# Patient Record
Sex: Female | Born: 1960
Health system: Southern US, Community
[De-identification: ages and names within clinical notes are randomized; demographics above are authoritative.]

## PROBLEM LIST (undated history)

## (undated) DIAGNOSIS — M1990 Unspecified osteoarthritis, unspecified site: Secondary | ICD-10-CM

## (undated) DIAGNOSIS — M549 Dorsalgia, unspecified: Secondary | ICD-10-CM

## (undated) DIAGNOSIS — I10 Essential (primary) hypertension: Secondary | ICD-10-CM

## (undated) HISTORY — PX: TUBAL LIGATION: SHX77

---

## 2009-09-05 ENCOUNTER — Emergency Department (HOSPITAL_BASED_OUTPATIENT_CLINIC_OR_DEPARTMENT_OTHER): Admission: EM | Admit: 2009-09-05 | Discharge: 2009-09-06 | Payer: Self-pay | Admitting: Emergency Medicine

## 2009-09-06 ENCOUNTER — Ambulatory Visit: Payer: Self-pay | Admitting: Radiology

## 2010-01-12 ENCOUNTER — Emergency Department (HOSPITAL_BASED_OUTPATIENT_CLINIC_OR_DEPARTMENT_OTHER): Admission: EM | Admit: 2010-01-12 | Discharge: 2010-01-12 | Payer: Self-pay | Admitting: Emergency Medicine

## 2010-05-01 ENCOUNTER — Emergency Department (HOSPITAL_BASED_OUTPATIENT_CLINIC_OR_DEPARTMENT_OTHER): Admission: EM | Admit: 2010-05-01 | Discharge: 2010-05-02 | Payer: Self-pay | Admitting: Emergency Medicine

## 2010-07-09 ENCOUNTER — Emergency Department (HOSPITAL_BASED_OUTPATIENT_CLINIC_OR_DEPARTMENT_OTHER): Admission: EM | Admit: 2010-07-09 | Discharge: 2010-07-09 | Payer: Self-pay | Admitting: Emergency Medicine

## 2010-10-28 LAB — CBC
HCT: 32.5 % — ABNORMAL LOW (ref 36.0–46.0)
MCV: 78.6 fL (ref 78.0–100.0)
Platelets: 268 10*3/uL (ref 150–400)
RBC: 4.13 MIL/uL (ref 3.87–5.11)
WBC: 9 10*3/uL (ref 4.0–10.5)

## 2010-10-28 LAB — DIFFERENTIAL
Basophils Absolute: 0.1 10*3/uL (ref 0.0–0.1)
Basophils Relative: 1 % (ref 0–1)
Monocytes Absolute: 0.7 10*3/uL (ref 0.1–1.0)
Neutro Abs: 6 10*3/uL (ref 1.7–7.7)
Neutrophils Relative %: 68 % (ref 43–77)

## 2010-10-28 LAB — COMPREHENSIVE METABOLIC PANEL
ALT: 11 U/L (ref 0–35)
Albumin: 4.1 g/dL (ref 3.5–5.2)
Alkaline Phosphatase: 54 U/L (ref 39–117)
BUN: 19 mg/dL (ref 6–23)
Chloride: 106 mEq/L (ref 96–112)
Glucose, Bld: 87 mg/dL (ref 70–99)
Potassium: 4 mEq/L (ref 3.5–5.1)
Sodium: 142 mEq/L (ref 135–145)
Total Bilirubin: 0.5 mg/dL (ref 0.3–1.2)

## 2010-10-28 LAB — LIPASE, BLOOD: Lipase: 94 U/L (ref 23–300)

## 2010-10-28 LAB — POCT CARDIAC MARKERS: Myoglobin, poc: 51.3 ng/mL (ref 12–200)

## 2011-01-09 ENCOUNTER — Emergency Department (HOSPITAL_BASED_OUTPATIENT_CLINIC_OR_DEPARTMENT_OTHER)
Admission: EM | Admit: 2011-01-09 | Discharge: 2011-01-09 | Disposition: A | Discharge status: Home / Self Care | Payer: Self-pay | Attending: Emergency Medicine | Admitting: Emergency Medicine

## 2011-01-09 DIAGNOSIS — N898 Other specified noninflammatory disorders of vagina: Secondary | ICD-10-CM | POA: Insufficient documentation

## 2011-01-09 DIAGNOSIS — I10 Essential (primary) hypertension: Secondary | ICD-10-CM | POA: Insufficient documentation

## 2011-01-09 DIAGNOSIS — G8929 Other chronic pain: Secondary | ICD-10-CM | POA: Insufficient documentation

## 2011-01-09 LAB — DIFFERENTIAL
Basophils Absolute: 0 10*3/uL (ref 0.0–0.1)
Basophils Relative: 0 % (ref 0–1)
Eosinophils Absolute: 0.1 10*3/uL (ref 0.0–0.7)
Eosinophils Relative: 1 % (ref 0–5)
Lymphocytes Relative: 21 % (ref 12–46)
Monocytes Absolute: 0.6 10*3/uL (ref 0.1–1.0)

## 2011-01-09 LAB — CBC
Hemoglobin: 10.4 g/dL — ABNORMAL LOW (ref 12.0–15.0)
MCH: 25.7 pg — ABNORMAL LOW (ref 26.0–34.0)
MCV: 77.5 fL — ABNORMAL LOW (ref 78.0–100.0)
RBC: 4.04 MIL/uL (ref 3.87–5.11)
WBC: 8.2 10*3/uL (ref 4.0–10.5)

## 2011-01-09 LAB — WET PREP, GENITAL: Trich, Wet Prep: NONE SEEN

## 2011-01-09 LAB — PREGNANCY, URINE: Preg Test, Ur: NEGATIVE

## 2011-01-10 ENCOUNTER — Ambulatory Visit (INDEPENDENT_AMBULATORY_CARE_PROVIDER_SITE_OTHER)
Admit: 2011-01-10 | Discharge: 2011-01-10 | Disposition: A | Discharge status: Home / Self Care | Payer: Self-pay | Attending: Emergency Medicine | Admitting: Emergency Medicine

## 2011-01-10 ENCOUNTER — Other Ambulatory Visit: Payer: Self-pay | Admitting: Hematology & Oncology

## 2011-01-10 ENCOUNTER — Ambulatory Visit (HOSPITAL_BASED_OUTPATIENT_CLINIC_OR_DEPARTMENT_OTHER)
Admission: RE | Admit: 2011-01-10 | Discharge: 2011-01-10 | Disposition: A | Discharge status: Home / Self Care | Payer: Self-pay | Source: Ambulatory Visit | Attending: Emergency Medicine | Admitting: Emergency Medicine

## 2011-01-10 DIAGNOSIS — N898 Other specified noninflammatory disorders of vagina: Secondary | ICD-10-CM

## 2011-01-10 LAB — GC/CHLAMYDIA PROBE AMP, GENITAL: GC Probe Amp, Genital: NEGATIVE

## 2011-03-01 ENCOUNTER — Emergency Department (HOSPITAL_BASED_OUTPATIENT_CLINIC_OR_DEPARTMENT_OTHER)
Admission: EM | Admit: 2011-03-01 | Discharge: 2011-03-01 | Disposition: A | Discharge status: Home / Self Care | Payer: Self-pay | Attending: Emergency Medicine | Admitting: Emergency Medicine

## 2011-03-01 ENCOUNTER — Encounter: Payer: Self-pay | Admitting: Emergency Medicine

## 2011-03-01 DIAGNOSIS — A499 Bacterial infection, unspecified: Secondary | ICD-10-CM | POA: Insufficient documentation

## 2011-03-01 DIAGNOSIS — R350 Frequency of micturition: Secondary | ICD-10-CM | POA: Insufficient documentation

## 2011-03-01 DIAGNOSIS — N760 Acute vaginitis: Secondary | ICD-10-CM | POA: Insufficient documentation

## 2011-03-01 DIAGNOSIS — B9689 Other specified bacterial agents as the cause of diseases classified elsewhere: Secondary | ICD-10-CM | POA: Insufficient documentation

## 2011-03-01 DIAGNOSIS — Z8739 Personal history of other diseases of the musculoskeletal system and connective tissue: Secondary | ICD-10-CM | POA: Insufficient documentation

## 2011-03-01 HISTORY — DX: Unspecified osteoarthritis, unspecified site: M19.90

## 2011-03-01 LAB — URINALYSIS, ROUTINE W REFLEX MICROSCOPIC
Glucose, UA: NEGATIVE mg/dL
Hgb urine dipstick: NEGATIVE
Leukocytes, UA: NEGATIVE
Protein, ur: NEGATIVE mg/dL
Specific Gravity, Urine: 1.012 (ref 1.005–1.030)
pH: 7 (ref 5.0–8.0)

## 2011-03-01 LAB — WET PREP, GENITAL
Trich, Wet Prep: NONE SEEN
Yeast Wet Prep HPF POC: NONE SEEN

## 2011-03-01 MED ORDER — METRONIDAZOLE 500 MG PO TABS: 500.0000 mg | ORAL_TABLET | Freq: Two times a day (BID) | ORAL | Status: AC

## 2011-03-01 NOTE — ED Notes (Signed)
Report received from C. Deeann Cree

## 2011-03-01 NOTE — ED Notes (Signed)
Pt reports UTI symtoms x 48hrs

## 2011-03-01 NOTE — ED Provider Notes (Signed)
History     Chief Complaint  Patient presents with  . Urinary Frequency   HPI Comments: Pt has had lower abdominal pain for 3 days.  She has had mild dysuria and a vaginal discharge.  There has been no fever.  Patient is a 50 y.o. female presenting with abdominal pain.  Abdominal Pain The primary symptoms of the illness include abdominal pain, dysuria and vaginal discharge. The primary symptoms of the illness do not include fever. The current episode started more than 2 days ago. The onset of the illness was gradual. The problem has been gradually worsening.  The abdominal pain is located in the suprapubic region. The abdominal pain does not radiate. The severity of the abdominal pain is 2/10. The abdominal pain is relieved by nothing.  The vaginal discharge was first noticed 2 days ago. Vaginal discharge is a new problem. The amount of discharge is scant. The vaginal discharge is associated with dysuria.  The patient states that she believes she is currently not pregnant. The patient has not had a change in bowel habit.    Past Medical History  Diagnosis Date  . Cancer   . Arthritis     Past Surgical History  Procedure Date  . Cesarean section     No family history on file.  History  Substance Use Topics  . Smoking status: Not on file  . Smokeless tobacco: Not on file  . Alcohol Use: No    OB History    Grav Para Term Preterm Abortions TAB SAB Ect Mult Living                  Review of Systems  Constitutional: Negative.  Negative for fever.  HENT: Negative.   Eyes: Negative.   Respiratory: Negative.   Gastrointestinal: Positive for abdominal pain.  Genitourinary: Positive for dysuria and vaginal discharge.  Musculoskeletal: Negative.   Skin: Negative.   Psychiatric/Behavioral: Negative.     Physical Exam  BP 148/94  Pulse 73  Temp 98.6 F (37 C)  Resp 16  SpO2 100%  Physical Exam  Constitutional: She is oriented to person, place, and time. She appears  well-developed and well-nourished. No distress.  HENT:  Head: Normocephalic and atraumatic.  Eyes: EOM are normal. Pupils are equal, round, and reactive to light.  Neck: Normal range of motion. Neck supple.  Cardiovascular: Normal rate, regular rhythm and normal heart sounds.   Pulmonary/Chest: Effort normal and breath sounds normal.  Abdominal: Soft. Bowel sounds are normal. There is Tenderness: She localizes pain over the suprapubic region but there is no mass or tenderness therer..  Genitourinary: Uterus normal. Cervix exhibits discharge. Right adnexum displays no mass and no tenderness. Left adnexum displays no mass and no tenderness.  Musculoskeletal: Normal range of motion.  Neurological: She is alert and oriented to person, place, and time.  Skin: Skin is warm.  Psychiatric: She has a normal mood and affect.    ED Course  Procedures  MDM  Lab results showed a normal UA and a wet prep showing clue cells.  Rx with metronidazole to treat bacterial vaginosis.      Carleene Cooper III, MD 03/01/11 2041

## 2011-03-03 LAB — GC/CHLAMYDIA PROBE AMP, GENITAL
Chlamydia, DNA Probe: NEGATIVE
GC Probe Amp, Genital: NEGATIVE

## 2011-03-20 ENCOUNTER — Emergency Department (HOSPITAL_BASED_OUTPATIENT_CLINIC_OR_DEPARTMENT_OTHER)
Admission: EM | Admit: 2011-03-20 | Discharge: 2011-03-20 | Disposition: A | Discharge status: Home / Self Care | Payer: Self-pay | Attending: Emergency Medicine | Admitting: Emergency Medicine

## 2011-03-20 ENCOUNTER — Encounter (HOSPITAL_BASED_OUTPATIENT_CLINIC_OR_DEPARTMENT_OTHER): Payer: Self-pay | Admitting: *Deleted

## 2011-03-20 DIAGNOSIS — N898 Other specified noninflammatory disorders of vagina: Secondary | ICD-10-CM | POA: Insufficient documentation

## 2011-03-20 DIAGNOSIS — R300 Dysuria: Secondary | ICD-10-CM | POA: Insufficient documentation

## 2011-03-20 LAB — URINALYSIS, ROUTINE W REFLEX MICROSCOPIC
Glucose, UA: NEGATIVE mg/dL
Ketones, ur: NEGATIVE mg/dL
Leukocytes, UA: NEGATIVE
pH: 5.5 (ref 5.0–8.0)

## 2011-03-20 LAB — PREGNANCY, URINE: Preg Test, Ur: NEGATIVE

## 2011-03-20 NOTE — ED Notes (Signed)
Pt states she has lower abdominal discomfort and burning with urination states last month she was seen here for same and diagnosed with bacterial vaginosis states she feels like that is what is going on now

## 2011-03-20 NOTE — ED Provider Notes (Signed)
History     CSN: [id number] Arrival date & time: 03/20/2011  9:18 AM  Chief Complaint  Patient presents with  . Dysuria  . Vaginal Discharge   Patient is a 50 y.o. female presenting with dysuria and vaginal discharge. The history is provided by the patient.  Dysuria  This is a recurrent problem. The current episode started 2 days ago. The problem occurs intermittently. The problem has not changed since onset.The quality of the pain is described as burning. The pain is mild. There has been no fever. She is sexually active. Associated symptoms include discharge. Pertinent negatives include no chills, no nausea, no vomiting, no hematuria, no urgency and no flank pain. She has tried nothing for the symptoms. Her past medical history does not include kidney stones.  Vaginal Discharge Pertinent negatives include no chest pain, no abdominal pain, no headaches and no shortness of breath.  white discharge with h/o BV and these symptoms feel the same. She has some associated suprapubic discomfort. No radiation of pain, no back pain. No rash. No itching.  Past Medical History  Diagnosis Date  . Cancer   . Arthritis     Past Surgical History  Procedure Date  . Cesarean section     History reviewed. No pertinent family history.  History  Substance Use Topics  . Smoking status: Not on file  . Smokeless tobacco: Not on file  . Alcohol Use: No    OB History    Grav Para Term Preterm Abortions TAB SAB Ect Mult Living                  Review of Systems  Constitutional: Negative for fever and chills.  HENT: Negative for neck pain and neck stiffness.   Eyes: Negative for pain.  Respiratory: Negative for shortness of breath.   Cardiovascular: Negative for chest pain.  Gastrointestinal: Negative for nausea, vomiting and abdominal pain.  Genitourinary: Positive for dysuria and vaginal discharge. Negative for urgency, hematuria and flank pain.  Musculoskeletal: Negative for back pain.  Skin:  Negative for rash.  Neurological: Negative for headaches.  All other systems reviewed and are negative.    Physical Exam  BP 121/73  Pulse 86  Temp(Src) 98.3 F (36.8 C) (Oral)  Resp 18  SpO2 100%  LMP 03/08/2011  Physical Exam  Constitutional: She is oriented to person, place, and time. She appears well-developed and well-nourished.  HENT:  Head: Normocephalic and atraumatic.  Eyes: Conjunctivae and EOM are normal. Pupils are equal, round, and reactive to light.  Neck: Trachea normal. Neck supple. No thyromegaly present.  Cardiovascular: Normal rate, regular rhythm, S1 normal, S2 normal and normal pulses.     No systolic murmur is present   No diastolic murmur is present  Pulses:      Radial pulses are 2+ on the right side, and 2+ on the left side.  Pulmonary/Chest: Effort normal and breath sounds normal. She has no rhonchi.  Abdominal: Soft. Normal appearance and bowel sounds are normal. She exhibits no distension. There is no CVA tenderness and negative Murphy's sign.       Localizes discomfort to suprapubic region, no reproducible tenderness with deep palpation  Genitourinary: There is no rash or lesion on the right labia. There is no rash or lesion on the left labia. No tenderness or bleeding around the vagina. Vaginal discharge found.       White discharge no CMT and no adnexal tenderness  Neurological: She is alert and oriented to  person, place, and time. She has normal strength. No cranial nerve deficit or sensory deficit. GCS eye subscore is 4. GCS verbal subscore is 5. GCS motor subscore is 6.  Skin: Skin is warm and dry. No rash noted. She is not diaphoretic.  Psychiatric: Her speech is normal.       Cooperative and appropriate    ED Course  Procedures  MDM  Adult female with recurrent vag discharge, no ABD tenderness serial exams, some dysuria but no UTI symptoms otherwsie, no systemic symptoms.   Results for orders placed during the hospital encounter of  03/20/11  URINALYSIS, ROUTINE W REFLEX MICROSCOPIC      Component Value Range   Color, Urine YELLOW  YELLOW    Appearance CLEAR  CLEAR    Specific Gravity, Urine 1.025  1.005 - 1.030    pH 5.5  5.0 - 8.0    Glucose, UA NEGATIVE  NEGATIVE (mg/dL)   Hgb urine dipstick NEGATIVE  NEGATIVE    Bilirubin Urine LARGE (*) NEGATIVE    Ketones, ur NEGATIVE  NEGATIVE (mg/dL)   Protein, ur NEGATIVE  NEGATIVE (mg/dL)   Urobilinogen, UA 0.2  0.0 - 1.0 (mg/dL)   Nitrite NEGATIVE  NEGATIVE    Leukocytes, UA NEGATIVE  NEGATIVE   PREGNANCY, URINE      Component Value Range   Preg Test, Ur NEGATIVE    WET PREP, GENITAL      Component Value Range   Yeast, Wet Prep NONE SEEN  NONE SEEN    Trich, Wet Prep NONE SEEN  NONE SEEN    Clue Cells, Wet Prep NONE SEEN  NONE SEEN    WBC, Wet Prep HPF POC NONE SEEN  NONE SEEN    GYN referral provided, no indication for further ED work up at this time.        Sunnie Nielsen, MD 03/20/11 2131

## 2011-03-20 NOTE — ED Notes (Signed)
Pelvic exam performed by dr Dierdre Highman with this  RN assisting. Pt tolerated well . Pt informed that it will be about 30 minutes before the results are complete then MD will be back in to give her results and discharge instructions /. Pt offered  Drink and or food pt declines at present.

## 2011-03-20 NOTE — ED Notes (Signed)
Patient is resting comfortably. 

## 2011-03-22 LAB — URINE CULTURE
Colony Count: 2000
Culture  Setup Time: 201208100625

## 2011-03-24 LAB — GC/CHLAMYDIA PROBE AMP, GENITAL: GC Probe Amp, Genital: NEGATIVE

## 2012-09-17 ENCOUNTER — Encounter (HOSPITAL_BASED_OUTPATIENT_CLINIC_OR_DEPARTMENT_OTHER): Payer: Self-pay

## 2012-09-17 ENCOUNTER — Emergency Department (HOSPITAL_BASED_OUTPATIENT_CLINIC_OR_DEPARTMENT_OTHER)
Admission: EM | Admit: 2012-09-17 | Discharge: 2012-09-17 | Disposition: A | Discharge status: Home / Self Care | Payer: BC Managed Care – PPO | Attending: Emergency Medicine | Admitting: Emergency Medicine

## 2012-09-17 DIAGNOSIS — Y939 Activity, unspecified: Secondary | ICD-10-CM | POA: Insufficient documentation

## 2012-09-17 DIAGNOSIS — X58XXXA Exposure to other specified factors, initial encounter: Secondary | ICD-10-CM | POA: Insufficient documentation

## 2012-09-17 DIAGNOSIS — Y929 Unspecified place or not applicable: Secondary | ICD-10-CM | POA: Insufficient documentation

## 2012-09-17 DIAGNOSIS — S339XXA Sprain of unspecified parts of lumbar spine and pelvis, initial encounter: Secondary | ICD-10-CM | POA: Insufficient documentation

## 2012-09-17 DIAGNOSIS — R3915 Urgency of urination: Secondary | ICD-10-CM | POA: Insufficient documentation

## 2012-09-17 DIAGNOSIS — Z79899 Other long term (current) drug therapy: Secondary | ICD-10-CM | POA: Insufficient documentation

## 2012-09-17 DIAGNOSIS — S39012A Strain of muscle, fascia and tendon of lower back, initial encounter: Secondary | ICD-10-CM

## 2012-09-17 DIAGNOSIS — M129 Arthropathy, unspecified: Secondary | ICD-10-CM | POA: Insufficient documentation

## 2012-09-17 DIAGNOSIS — I10 Essential (primary) hypertension: Secondary | ICD-10-CM | POA: Insufficient documentation

## 2012-09-17 HISTORY — DX: Essential (primary) hypertension: I10

## 2012-09-17 LAB — URINALYSIS, ROUTINE W REFLEX MICROSCOPIC
Bilirubin Urine: NEGATIVE
Glucose, UA: NEGATIVE mg/dL
Ketones, ur: NEGATIVE mg/dL
Protein, ur: NEGATIVE mg/dL
pH: 5.5 (ref 5.0–8.0)

## 2012-09-17 LAB — URINE MICROSCOPIC-ADD ON

## 2012-09-17 MED ORDER — IBUPROFEN 600 MG PO TABS: 600.0000 mg | ORAL_TABLET | Freq: Four times a day (QID) | ORAL | Status: DC | PRN

## 2012-09-17 MED ORDER — KETOROLAC TROMETHAMINE 60 MG/2ML IM SOLN
60.0000 mg | Freq: Once | INTRAMUSCULAR | Status: AC
  Administered 2012-09-17: 60 mg via INTRAMUSCULAR
  Filled 2012-09-17: qty 2

## 2012-09-17 MED ORDER — METHOCARBAMOL 500 MG PO TABS
500.0000 mg | ORAL_TABLET | Freq: Once | ORAL | Status: AC
  Administered 2012-09-17: 500 mg via ORAL
  Filled 2012-09-17: qty 1

## 2012-09-17 MED ORDER — METHOCARBAMOL 500 MG PO TABS: 500.0000 mg | ORAL_TABLET | Freq: Two times a day (BID) | ORAL | Status: DC

## 2012-09-17 NOTE — ED Provider Notes (Signed)
History  This chart was scribed for Loren Racer, MD by Bennett Scrape, ED Scribe. This patient was seen in room MH05/MH05 and the patient's care was started at 8:30 PM.  CSN: [id number]  Arrival date & time 09/17/12  2012   First MD Initiated Contact with Patient 09/17/12 2030      Chief Complaint  Patient presents with  . Back Pain     Patient is a 52 y.o. female presenting with back pain. The history is provided by the patient. No language interpreter was used.  Back Pain  This is a recurrent problem. The current episode started more than 2 days ago. The problem occurs constantly. The problem has been gradually worsening. The pain is associated with no known injury. The pain is present in the lumbar spine. Quality: tightness. The pain radiates to the left thigh and right thigh. Pertinent negatives include no numbness, no abdominal pain, no dysuria and no weakness.    Barbara Christensen is a 52 y.o. female who presents to the Emergency Department complaining of one week of lower back pain described as a tightness with associated urinary urgency. She states that the pain radiates into the posterior upper thigh pain, the left side worse being than the right. The pain is worse with movement. She denies any recent traumas, falls or heavy activity. She reports that she has been taking tylenol at home with no improvement. She states that she has chronic problems with the left lower back and was on gabapentin and flexeril prescribed by her PCP but ran out and was unable to get a refill due to cost. She denies dysuria, frequency, urinary or bowel incontinence and weakness or numbness in her legs as associated symptoms. She has a h/o arthritis and HTN and denies smoking and alcohol use.  Past Medical History  Diagnosis Date  . Arthritis   . Hypertension     Past Surgical History  Procedure Date  . Cesarean section   . Tubal ligation     No family history on file.  History  Substance Use  Topics  . Smoking status: Never Smoker   . Smokeless tobacco: Not on file  . Alcohol Use: No    No OB history provided.  Review of Systems  Gastrointestinal: Negative for nausea, vomiting and abdominal pain.  Genitourinary: Positive for urgency. Negative for dysuria, frequency and hematuria.  Musculoskeletal: Positive for back pain.  Neurological: Negative for weakness and numbness.  All other systems reviewed and are negative.    Allergies  Review of patient's allergies indicates no known allergies.  Home Medications   Current Outpatient Rx  Name  Route  Sig  Dispense  Refill  . HEMAX 150-1 MG PO TABS   Oral   Take 150 mg by mouth daily.         Marland Kitchen LISINOPRIL-HYDROCHLOROTHIAZIDE 20-12.5 MG PO TABS   Oral   Take 2 tablets by mouth daily.         . ETODOLAC 500 MG PO TABS   Oral   Take 500 mg by mouth 2 (two) times daily.           . IBUPROFEN 600 MG PO TABS   Oral   Take 1 tablet (600 mg total) by mouth every 6 (six) hours as needed for pain.   30 tablet   0   . POLYSACCHARIDE IRON COMPLEX 150 MG PO CAPS   Oral   Take 150 mg by mouth 2 (two) times daily.           Marland Kitchen  METHOCARBAMOL 500 MG PO TABS   Oral   Take 1 tablet (500 mg total) by mouth 2 (two) times daily.   20 tablet   0   . OLMESARTAN MEDOXOMIL 40 MG PO TABS   Oral   Take 40 mg by mouth daily.             Triage Vitals: BP 166/84  Pulse 80  Temp 97.7 F (36.5 C) (Oral)  Resp 20  Ht 5\' 2"  (1.575 m)  Wt 247 lb (112.038 kg)  BMI 45.18 kg/m2  SpO2 99%  LMP 08/14/2012  Physical Exam  Nursing note and vitals reviewed. Constitutional: She is oriented to person, place, and time. She appears well-developed and well-nourished. No distress.  HENT:  Head: Normocephalic and atraumatic.  Eyes: Conjunctivae normal and EOM are normal.  Neck: Neck supple. No tracheal deviation present.  Cardiovascular: Normal rate and regular rhythm.   Pulmonary/Chest: Effort normal and breath sounds normal.  No respiratory distress.  Abdominal: Soft. There is no tenderness.  Musculoskeletal: Normal range of motion. She exhibits tenderness.       Tenderness to the bilateral paraspinous lumbar muscles, negative straight leg raise bilaterally  Neurological: She is alert and oriented to person, place, and time.       Sensation and motor are intact  Skin: Skin is warm and dry.  Psychiatric: She has a normal mood and affect. Her behavior is normal.    ED Course  Procedures (including critical care time)  DIAGNOSTIC STUDIES: Oxygen Saturation is 99% on room air, normal by my interpretation.    COORDINATION OF CARE: 8:40 PM-Discussed treatment plan which includes pain control and UA with pt at bedside and pt agreed to plan.   8:45 PM- Ordered 60 mg Toradol injection and 500 mg Robaxin tablet  Labs Reviewed  URINALYSIS, ROUTINE W REFLEX MICROSCOPIC - Abnormal; Notable for the following:    Specific Gravity, Urine 1.031 (*)     Leukocytes, UA TRACE (*)     All other components within normal limits  URINE MICROSCOPIC-ADD ON   No results found.   1. Lumbosacral strain       MDM  I personally performed the services described in this documentation, which was scribed in my presence. The recorded information has been reviewed and is accurate.    Loren Racer, MD 09/17/12 2249

## 2012-09-17 NOTE — ED Notes (Signed)
Pt complains of lower back pain and painful urination.  Also complains the pain is radiating down both legs.

## 2013-08-08 ENCOUNTER — Encounter (HOSPITAL_BASED_OUTPATIENT_CLINIC_OR_DEPARTMENT_OTHER): Payer: Self-pay | Admitting: Emergency Medicine

## 2013-08-08 ENCOUNTER — Emergency Department (HOSPITAL_BASED_OUTPATIENT_CLINIC_OR_DEPARTMENT_OTHER)
Admission: EM | Admit: 2013-08-08 | Discharge: 2013-08-08 | Disposition: A | Discharge status: Home / Self Care | Payer: BC Managed Care – PPO | Attending: Emergency Medicine | Admitting: Emergency Medicine

## 2013-08-08 ENCOUNTER — Emergency Department (HOSPITAL_BASED_OUTPATIENT_CLINIC_OR_DEPARTMENT_OTHER): Payer: BC Managed Care – PPO

## 2013-08-08 DIAGNOSIS — M129 Arthropathy, unspecified: Secondary | ICD-10-CM | POA: Insufficient documentation

## 2013-08-08 DIAGNOSIS — S0990XA Unspecified injury of head, initial encounter: Secondary | ICD-10-CM | POA: Insufficient documentation

## 2013-08-08 DIAGNOSIS — S39012A Strain of muscle, fascia and tendon of lower back, initial encounter: Secondary | ICD-10-CM

## 2013-08-08 DIAGNOSIS — S46909A Unspecified injury of unspecified muscle, fascia and tendon at shoulder and upper arm level, unspecified arm, initial encounter: Secondary | ICD-10-CM | POA: Insufficient documentation

## 2013-08-08 DIAGNOSIS — Y9389 Activity, other specified: Secondary | ICD-10-CM | POA: Insufficient documentation

## 2013-08-08 DIAGNOSIS — I10 Essential (primary) hypertension: Secondary | ICD-10-CM | POA: Insufficient documentation

## 2013-08-08 DIAGNOSIS — Z79899 Other long term (current) drug therapy: Secondary | ICD-10-CM | POA: Insufficient documentation

## 2013-08-08 DIAGNOSIS — Y92410 Unspecified street and highway as the place of occurrence of the external cause: Secondary | ICD-10-CM | POA: Insufficient documentation

## 2013-08-08 DIAGNOSIS — S335XXA Sprain of ligaments of lumbar spine, initial encounter: Secondary | ICD-10-CM | POA: Insufficient documentation

## 2013-08-08 DIAGNOSIS — S4980XA Other specified injuries of shoulder and upper arm, unspecified arm, initial encounter: Secondary | ICD-10-CM | POA: Insufficient documentation

## 2013-08-08 MED ORDER — IBUPROFEN 800 MG PO TABS
800.0000 mg | ORAL_TABLET | Freq: Once | ORAL | Status: AC
  Administered 2013-08-08: 800 mg via ORAL
  Filled 2013-08-08: qty 1

## 2013-08-08 MED ORDER — METHOCARBAMOL 500 MG PO TABS: 500.0000 mg | ORAL_TABLET | Freq: Two times a day (BID) | ORAL | Status: DC

## 2013-08-08 MED ORDER — IBUPROFEN 600 MG PO TABS: 600.0000 mg | ORAL_TABLET | Freq: Four times a day (QID) | ORAL | Status: DC | PRN
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
## 2014-06-27 ENCOUNTER — Encounter (HOSPITAL_BASED_OUTPATIENT_CLINIC_OR_DEPARTMENT_OTHER): Payer: Self-pay | Admitting: *Deleted

## 2014-06-27 ENCOUNTER — Emergency Department (HOSPITAL_BASED_OUTPATIENT_CLINIC_OR_DEPARTMENT_OTHER)
Admission: EM | Admit: 2014-06-27 | Discharge: 2014-06-27 | Disposition: A | Discharge status: Home / Self Care | Payer: No Typology Code available for payment source | Attending: Emergency Medicine | Admitting: Emergency Medicine

## 2014-06-27 DIAGNOSIS — Z791 Long term (current) use of non-steroidal anti-inflammatories (NSAID): Secondary | ICD-10-CM | POA: Insufficient documentation

## 2014-06-27 DIAGNOSIS — J069 Acute upper respiratory infection, unspecified: Secondary | ICD-10-CM | POA: Insufficient documentation

## 2014-06-27 DIAGNOSIS — M791 Myalgia: Secondary | ICD-10-CM | POA: Insufficient documentation

## 2014-06-27 DIAGNOSIS — Z79899 Other long term (current) drug therapy: Secondary | ICD-10-CM | POA: Insufficient documentation

## 2014-06-27 DIAGNOSIS — I10 Essential (primary) hypertension: Secondary | ICD-10-CM | POA: Insufficient documentation

## 2014-06-27 MED ORDER — LORATADINE 10 MG PO TABS: 10.0000 mg | ORAL_TABLET | Freq: Every day | ORAL | Status: DC

## 2014-06-27 MED ORDER — DEXAMETHASONE 4 MG PO TABS
ORAL_TABLET | ORAL | Status: AC
  Filled 2014-06-27: qty 3

## 2014-06-27 MED ORDER — DEXAMETHASONE 4 MG PO TABS
10.0000 mg | ORAL_TABLET | Freq: Once | ORAL | Status: AC
  Administered 2014-06-27: 10 mg via ORAL

## 2014-06-27 NOTE — Discharge Instructions (Signed)
Cool Mist Vaporizers °Vaporizers may help relieve the symptoms of a cough and cold. They add moisture to the air, which helps mucus to become thinner and less sticky. This makes it easier to breathe and cough up secretions. Cool mist vaporizers do not cause serious burns like hot mist vaporizers, which may also be called steamers or humidifiers. Vaporizers have not been proven to help with colds. You should not use a vaporizer if you are allergic to mold. °HOME CARE INSTRUCTIONS °· Follow the package instructions for the vaporizer. °· Do not use anything other than distilled water in the vaporizer. °· Do not run the vaporizer all of the time. This can cause mold or bacteria to grow in the vaporizer. °· Clean the vaporizer after each time it is used. °· Clean and dry the vaporizer well before storing it. °· Stop using the vaporizer if worsening respiratory symptoms develop. °Document Released: 04/24/2004 Document Revised: 08/02/2013 Document Reviewed: 12/15/2012 °[employer]® Patient Information ©2015 [employer]. This information is not intended to replace advice given to you by your health care provider. Make sure you discuss any questions you have with your health care provider. ° °

## 2014-06-27 NOTE — ED Notes (Signed)
1 week sore throat, runny nose, chills

## 2014-06-27 NOTE — ED Provider Notes (Signed)
CSN: [id number]     Arrival date & time 06/27/14  1829 History   First MD Initiated Contact with Patient 06/27/14 1923     Chief Complaint  Patient presents with  . URI     (Consider location/radiation/quality/duration/timing/severity/associated sxs/prior Treatment) Patient is a 53 y.o. female presenting with URI. The history is provided by the patient.  URI Presenting symptoms: congestion, facial pain and sore throat   Presenting symptoms: no cough, no ear pain and no fever   Severity:  Moderate Onset quality:  Gradual Duration:  1 week Timing:  Constant Progression:  Worsening Chronicity:  New Relieved by:  Nothing Ineffective treatments:  Decongestant and OTC medications Associated symptoms: myalgias and sinus pain   Associated symptoms: no headaches    Tamela OddiMaxine Kainz is a 53 y.o. female who presents to the ED with runny nose, chills, sore throat and aching all over for the past week. She has been taking OTC cough and flu medications without relief.  Past Medical History  Diagnosis Date  . Arthritis   . Hypertension    Past Surgical History  Procedure Laterality Date  . Cesarean section    . Tubal ligation     No family history on file. History  Substance Use Topics  . Smoking status: Never Smoker   . Smokeless tobacco: Not on file  . Alcohol Use: No   OB History    No data available     Review of Systems  Constitutional: Positive for chills. Negative for fever.  HENT: Positive for congestion, sinus pressure and sore throat. Negative for ear pain, facial swelling and trouble swallowing.   Eyes: Positive for itching. Negative for redness and visual disturbance.  Respiratory: Negative for cough.   Gastrointestinal: Negative for nausea, vomiting, abdominal pain and diarrhea.  Genitourinary: Negative for dysuria, urgency and frequency.  Musculoskeletal: Positive for myalgias. Negative for back pain.  Skin: Negative for rash.  Neurological: Negative for syncope  and headaches.  Psychiatric/Behavioral: Negative for confusion. The patient is not nervous/anxious.       Allergies  Review of patient's allergies indicates no known allergies.  Home Medications   Prior to Admission medications   Medication Sig Start Date End Date Taking? Authorizing Provider  lisinopril-hydrochlorothiazide (PRINZIDE,ZESTORETIC) 20-12.5 MG per tablet Take 2 tablets by mouth daily.   Yes Historical Provider, MD  Multiple Vitamins-Minerals (MULTIVITAMIN WITH MINERALS) tablet Take 1 tablet by mouth daily.   Yes Historical Provider, MD  etodolac (LODINE) 500 MG tablet Take 500 mg by mouth 2 (two) times daily.      Historical Provider, MD  ibuprofen (ADVIL,MOTRIN) 600 MG tablet Take 1 tablet (600 mg total) by mouth every 6 (six) hours as needed. 08/08/13   Glynn OctaveStephen Rancour, MD  iron polysaccharides (NIFEREX) 150 MG capsule Take 150 mg by mouth 2 (two) times daily.      Historical Provider, MD  Iron-DSS-B12-FA-C-E-Cu-Biotin (HEMAX) 150-1 MG TABS Take 150 mg by mouth daily.    Historical Provider, MD  methocarbamol (ROBAXIN) 500 MG tablet Take 1 tablet (500 mg total) by mouth 2 (two) times daily. 08/08/13   Glynn OctaveStephen Rancour, MD  olmesartan (BENICAR) 40 MG tablet Take 40 mg by mouth daily.      Historical Provider, MD   BP 121/64 mmHg  Pulse 74  Temp(Src) 98.1 F (36.7 C) (Oral)  Resp 18  SpO2 99% Physical Exam  Constitutional: She is oriented to person, place, and time. She appears well-developed and well-nourished. No distress.  HENT:  Head:  Normocephalic and atraumatic.  Right Ear: Tympanic membrane normal.  Left Ear: Tympanic membrane normal.  Nose: Rhinorrhea present.  Mouth/Throat: Uvula is midline, oropharynx is clear and moist and mucous membranes are normal.  Eyes: EOM are normal.  Neck: Neck supple.  Cardiovascular: Normal rate and regular rhythm.   Pulmonary/Chest: Effort normal. No respiratory distress. She has no wheezes. She has no rales.  Abdominal: Soft.  Bowel sounds are normal. There is no tenderness.  Musculoskeletal: Normal range of motion.  Lymphadenopathy:    She has no cervical adenopathy.  Neurological: She is alert and oriented to person, place, and time. No cranial nerve deficit.  Skin: Skin is warm and dry.  Psychiatric: She has a normal mood and affect. Her behavior is normal.  Nursing note and vitals reviewed.   ED Course  Procedures  MDM  Discussed with Dr. Littie DeedsGentry. Will give Decadron 10 mg PO x 1 dose 53 y.o. female with sore throat, nasal congestion and body aches. Stable for discharge without fever, meningeal signs or difficulty swallowing. Discussed with the patient clinical findings and plan of care and all questioned fully answered. She will return if any problems arise.    Medication List    STOP taking these medications        etodolac 500 MG tablet  Commonly known as:  LODINE     ibuprofen 600 MG tablet  Commonly known as:  ADVIL,MOTRIN      TAKE these medications        loratadine 10 MG tablet  Commonly known as:  CLARITIN  Take 1 tablet (10 mg total) by mouth daily.      ASK your doctor about these medications        HEMAX 150-1 MG Tabs  Take 150 mg by mouth daily.     iron polysaccharides 150 MG capsule  Commonly known as:  NIFEREX  Take 150 mg by mouth 2 (two) times daily.     lisinopril-hydrochlorothiazide 20-12.5 MG per tablet  Commonly known as:  PRINZIDE,ZESTORETIC  Take 2 tablets by mouth daily.     methocarbamol 500 MG tablet  Commonly known as:  ROBAXIN  Take 1 tablet (500 mg total) by mouth 2 (two) times daily.     multivitamin with minerals tablet  Take 1 tablet by mouth daily.     olmesartan 40 MG tablet  Commonly known as:  BENICAR  Take 40 mg by mouth daily.           [place] PinesHope M Leontine Radman, [place]NP 06/27/14 2038  Mirian MoMatthew Gentry, MD 06/28/14 [phone]

## 2015-09-16 ENCOUNTER — Encounter (HOSPITAL_BASED_OUTPATIENT_CLINIC_OR_DEPARTMENT_OTHER): Payer: Self-pay | Admitting: *Deleted

## 2015-09-16 ENCOUNTER — Emergency Department (HOSPITAL_BASED_OUTPATIENT_CLINIC_OR_DEPARTMENT_OTHER)
Admission: EM | Admit: 2015-09-16 | Discharge: 2015-09-16 | Disposition: A | Discharge status: Home / Self Care | Payer: Self-pay | Attending: Emergency Medicine | Admitting: Emergency Medicine

## 2015-09-16 ENCOUNTER — Emergency Department (HOSPITAL_BASED_OUTPATIENT_CLINIC_OR_DEPARTMENT_OTHER): Payer: Self-pay

## 2015-09-16 DIAGNOSIS — Z9889 Other specified postprocedural states: Secondary | ICD-10-CM | POA: Insufficient documentation

## 2015-09-16 DIAGNOSIS — I10 Essential (primary) hypertension: Secondary | ICD-10-CM | POA: Insufficient documentation

## 2015-09-16 DIAGNOSIS — Z9851 Tubal ligation status: Secondary | ICD-10-CM | POA: Insufficient documentation

## 2015-09-16 DIAGNOSIS — R079 Chest pain, unspecified: Secondary | ICD-10-CM | POA: Insufficient documentation

## 2015-09-16 DIAGNOSIS — M545 Low back pain: Secondary | ICD-10-CM | POA: Insufficient documentation

## 2015-09-16 DIAGNOSIS — Z79899 Other long term (current) drug therapy: Secondary | ICD-10-CM | POA: Insufficient documentation

## 2015-09-16 DIAGNOSIS — R1013 Epigastric pain: Secondary | ICD-10-CM | POA: Insufficient documentation

## 2015-09-16 LAB — CBC WITH DIFFERENTIAL/PLATELET
BASOS PCT: 0 %
Basophils Absolute: 0 10*3/uL (ref 0.0–0.1)
EOS ABS: 0.1 10*3/uL (ref 0.0–0.7)
Eosinophils Relative: 1 %
HCT: 36 % (ref 36.0–46.0)
HEMOGLOBIN: 11.5 g/dL — AB (ref 12.0–15.0)
Lymphocytes Relative: 35 %
Lymphs Abs: 3 10*3/uL (ref 0.7–4.0)
MCH: 24.7 pg — ABNORMAL LOW (ref 26.0–34.0)
MCHC: 31.9 g/dL (ref 30.0–36.0)
MCV: 77.3 fL — ABNORMAL LOW (ref 78.0–100.0)
Monocytes Absolute: 0.6 10*3/uL (ref 0.1–1.0)
Monocytes Relative: 6 %
NEUTROS PCT: 58 %
Neutro Abs: 4.9 10*3/uL (ref 1.7–7.7)
Platelets: 289 10*3/uL (ref 150–400)
RBC: 4.66 MIL/uL (ref 3.87–5.11)
RDW: 14.5 % (ref 11.5–15.5)
WBC: 8.6 10*3/uL (ref 4.0–10.5)

## 2015-09-16 LAB — COMPREHENSIVE METABOLIC PANEL
ALT: 13 U/L — ABNORMAL LOW (ref 14–54)
ANION GAP: 9 (ref 5–15)
AST: 17 U/L (ref 15–41)
Albumin: 4 g/dL (ref 3.5–5.0)
Alkaline Phosphatase: 64 U/L (ref 38–126)
BILIRUBIN TOTAL: 0.3 mg/dL (ref 0.3–1.2)
BUN: 18 mg/dL (ref 6–20)
CO2: 26 mmol/L (ref 22–32)
Calcium: 9.2 mg/dL (ref 8.9–10.3)
Chloride: 103 mmol/L (ref 101–111)
Creatinine, Ser: 1.04 mg/dL — ABNORMAL HIGH (ref 0.44–1.00)
GFR, EST NON AFRICAN AMERICAN: 60 mL/min — AB (ref >60)
Glucose, Bld: 127 mg/dL — ABNORMAL HIGH (ref 65–99)
POTASSIUM: 3.2 mmol/L — AB (ref 3.5–5.1)
Sodium: 138 mmol/L (ref 135–145)
TOTAL PROTEIN: 7.3 g/dL (ref 6.5–8.1)

## 2015-09-16 LAB — LIPASE, BLOOD: LIPASE: 24 U/L (ref 11–51)

## 2015-09-16 LAB — TROPONIN I: Troponin I: <0.03 ng/mL (ref <0.031)

## 2015-09-16 MED ORDER — OMEPRAZOLE 20 MG PO CPDR
20.0000 mg | DELAYED_RELEASE_CAPSULE | Freq: Every day | ORAL | Status: DC
Start: 2015-09-16 — End: 2016-11-10

## 2015-09-16 MED ORDER — CYCLOBENZAPRINE HCL 10 MG PO TABS: 10.0000 mg | ORAL_TABLET | Freq: Two times a day (BID) | ORAL | Status: DC | PRN

## 2015-09-16 NOTE — Discharge Instructions (Signed)
Nonspecific Chest Pain  °Chest pain can be caused by many different conditions. There is always a chance that your pain could be related to something serious, such as a heart attack or a blood clot in your lungs. Chest pain can also be caused by conditions that are not life-threatening. If you have chest pain, it is very important to follow up with your health care provider. °CAUSES  °Chest pain can be caused by: °· Heartburn. °· Pneumonia or bronchitis. °· Anxiety or stress. °· Inflammation around your heart (pericarditis) or lung (pleuritis or pleurisy). °· A blood clot in your lung. °· A collapsed lung (pneumothorax). It can develop suddenly on its own (spontaneous pneumothorax) or from trauma to the chest. °· Shingles infection (varicella-zoster virus). °· Heart attack. °· Damage to the bones, muscles, and cartilage that make up your chest wall. This can include: °¨ Bruised bones due to injury. °¨ Strained muscles or cartilage due to frequent or repeated coughing or overwork. °¨ Fracture to one or more ribs. °¨ Sore cartilage due to inflammation (costochondritis). °RISK FACTORS  °Risk factors for chest pain may include: °· Activities that increase your risk for trauma or injury to your chest. °· Respiratory infections or conditions that cause frequent coughing. °· Medical conditions or overeating that can cause heartburn. °· Heart disease or family history of heart disease. °· Conditions or health behaviors that increase your risk of developing a blood clot. °· Having had chicken pox (varicella zoster). °SIGNS AND SYMPTOMS °Chest pain can feel like: °· Burning or tingling on the surface of your chest or deep in your chest. °· Crushing, pressure, aching, or squeezing pain. °· Dull or sharp pain that is worse when you move, cough, or take a deep breath. °· Pain that is also felt in your back, neck, shoulder, or arm, or pain that spreads to any of these areas. °Your chest pain may come and go, or it may stay  constant. °DIAGNOSIS °Lab tests or other studies may be needed to find the cause of your pain. Your health care provider may have you take a test called an ambulatory ECG (electrocardiogram). An ECG records your heartbeat patterns at the time the test is performed. You may also have other tests, such as: °· Transthoracic echocardiogram (TTE). During echocardiography, sound waves are used to create a picture of all of the heart structures and to look at how blood flows through your heart. °· Transesophageal echocardiogram (TEE). This is a more advanced imaging test that obtains images from inside your body. It allows your health care provider to see your heart in finer detail. °· Cardiac monitoring. This allows your health care provider to monitor your heart rate and rhythm in real time. °· Holter monitor. This is a portable device that records your heartbeat and can help to diagnose abnormal heartbeats. It allows your health care provider to track your heart activity for several days, if needed. °· Stress tests. These can be done through exercise or by taking medicine that makes your heart beat more quickly. °· Blood tests. °· Imaging tests. °TREATMENT  °Your treatment depends on what is causing your chest pain. Treatment may include: °· Medicines. These may include: °¨ Acid blockers for heartburn. °¨ Anti-inflammatory medicine. °¨ Pain medicine for inflammatory conditions. °¨ Antibiotic medicine, if an infection is present. °¨ Medicines to dissolve blood clots. °¨ Medicines to treat coronary artery disease. °· Supportive care for conditions that do not require medicines. This may include: °¨ Resting. °¨ Applying heat   or cold packs to injured areas. °¨ Limiting activities until pain decreases. °HOME CARE INSTRUCTIONS °· If you were prescribed an antibiotic medicine, finish it all even if you start to feel better. °· Avoid any activities that bring on chest pain. °· Do not use any tobacco products, including  cigarettes, chewing tobacco, or electronic cigarettes. If you need help quitting, ask your health care provider. °· Do not drink alcohol. °· Take medicines only as directed by your health care provider. °· Keep all follow-up visits as directed by your health care provider. This is important. This includes any further testing if your chest pain does not go away. °· If heartburn is the cause for your chest pain, you may be told to keep your head raised (elevated) while sleeping. This reduces the chance that acid will go from your stomach into your esophagus. °· Make lifestyle changes as directed by your health care provider. These may include: °¨ Getting regular exercise. Ask your health care provider to suggest some activities that are safe for you. °¨ Eating a heart-healthy diet. A registered dietitian can help you to learn healthy eating options. °¨ Maintaining a healthy weight. °¨ Managing diabetes, if necessary. °¨ Reducing stress. °SEEK MEDICAL CARE IF: °· Your chest pain does not go away after treatment. °· You have a rash with blisters on your chest. °· You have a fever. °SEEK IMMEDIATE MEDICAL CARE IF:  °· Your chest pain is worse. °· You have an increasing cough, or you cough up blood. °· You have severe abdominal pain. °· You have severe weakness. °· You faint. °· You have chills. °· You have sudden, unexplained chest discomfort. °· You have sudden, unexplained discomfort in your arms, back, neck, or jaw. °· You have shortness of breath at any time. °· You suddenly start to sweat, or your skin gets clammy. °· You feel nauseous or you vomit. °· You suddenly feel light-headed or dizzy. °· Your heart begins to beat quickly, or it feels like it is skipping beats. °These symptoms may represent a serious problem that is an emergency. Do not wait to see if the symptoms will go away. Get medical help right away. Call your local emergency services (911 in the [place] Do not drive yourself to the hospital. °  °This  information is not intended to replace advice given to you by your health care provider. Make sure you discuss any questions you have with your health care provider. °  °Document Released: 05/07/2005 Document Revised: 08/18/2014 Document Reviewed: 03/03/2014 °Elsevier Interactive Patient Education ©2016 [employer] ° °

## 2015-09-16 NOTE — ED Notes (Signed)
supsternal CP x 2 days.  Increased pain with deep inspiration and movement.  Reports back pain-hx of same.  Denies N/V/SOB.

## 2015-09-16 NOTE — ED Provider Notes (Signed)
CSN: [id number]     Arrival date & time 09/16/15  1842 History  By signing my name below, I, Bethel Born, attest that this documentation has been prepared under the direction and in the presence of Benjiman Core, MD. Electronically Signed: Bethel Born, ED Scribe. 09/16/2015. 7:14 PM   Chief Complaint  Patient presents with  . Chest Pain     The history is provided by the patient. No language interpreter was used.   Barbara Christensen is a 55 y.o. female with history of HTN who presents to the Emergency Department complaining of intermittent, tight and burning, 9/10 in severity,  substernal chest pain with onset 2 days ago. Pt states that initially the pain felt like her typical reflux pain but this morning it also felt tight.  The pain is worse with deep inspiration and movement. She also has left lower back pain that she frequently treats with NSAIDs. Pt denies fever, cough, SOB, nausea, vomiting, and LE swelling. No recent long travel or  hormonal therapy. Pt denies smoking.   Past Medical History  Diagnosis Date  . Arthritis   . Hypertension    Past Surgical History  Procedure Laterality Date  . Cesarean section    . Tubal ligation     History reviewed. No pertinent family history. Social History  Substance Use Topics  . Smoking status: Never Smoker   . Smokeless tobacco: None  . Alcohol Use: No   OB History    No data available     Review of Systems  Constitutional: Negative for fever.  Respiratory: Negative for shortness of breath.   Cardiovascular: Positive for chest pain. Negative for leg swelling.  Gastrointestinal: Negative for nausea and vomiting.  Musculoskeletal: Positive for back pain.  Neurological: Negative for weakness.  All other systems reviewed and are negative.   Allergies  Review of patient's allergies indicates no known allergies.  Home Medications   Prior to Admission medications   Medication Sig Start Date End Date Taking? Authorizing  Provider  cyclobenzaprine (FLEXERIL) 10 MG tablet Take 1 tablet (10 mg total) by mouth 2 (two) times daily as needed for muscle spasms. 09/16/15   Benjiman Core, MD  lisinopril-hydrochlorothiazide (PRINZIDE,ZESTORETIC) 20-12.5 MG per tablet Take 2 tablets by mouth daily.    Historical Provider, MD  omeprazole (PRILOSEC) 20 MG capsule Take 1 capsule (20 mg total) by mouth daily. 09/16/15   Benjiman Core, MD   BP 139/71 mmHg  Pulse 84  Temp(Src) 98.2 F (36.8 C) (Oral)  Resp 18  Ht  (1.6 m)  Wt 275 lb (124.739 kg)  BMI 48.73 kg/m2  SpO2 99%  LMP 07/25/2013 Physical Exam  Constitutional: She is oriented to person, place, and time. She appears well-developed and well-nourished. No distress.  HENT:  Head: Normocephalic and atraumatic.  Eyes: EOM are normal.  Neck: Normal range of motion.  Cardiovascular: Normal rate, regular rhythm and normal heart sounds.   Pulmonary/Chest: Effort normal and breath sounds normal.  CTAB  Abdominal: Soft. She exhibits no distension. There is tenderness.  Mild epigastric/sternal tenderness  Musculoskeletal: Normal range of motion.  Mild lower back tenderness No peripheral edema Normal strength  Neurological: She is alert and oriented to person, place, and time.  Skin: Skin is warm and dry. No rash noted.  Psychiatric: She has a normal mood and affect. Judgment normal.  Nursing note and vitals reviewed.   ED Course  Procedures (including critical care time)  COORDINATION OF CARE: 7:08 PM Discussed treatment plan  which includes lab work and EKG  with pt at bedside and pt agreed to plan.  Labs Review Labs Reviewed  COMPREHENSIVE METABOLIC PANEL - Abnormal; Notable for the following:    Potassium 3.2 (*)    Glucose, Bld 127 (*)    Creatinine, Ser 1.04 (*)    ALT 13 (*)    GFR calc non Af Amer 60 (*)    All other components within normal limits  CBC WITH DIFFERENTIAL/PLATELET - Abnormal; Notable for the following:    Hemoglobin 11.5 (*)     MCV 77.3 (*)    MCH 24.7 (*)    All other components within normal limits  LIPASE, BLOOD  TROPONIN I    Imaging Review Dg Chest 2 View  09/16/2015  CLINICAL DATA:  Chest pain with onset 2 days ago. EXAM: CHEST  2 VIEW COMPARISON:  None. FINDINGS: The heart size and mediastinal contours are within normal limits. Both lungs are clear. The visualized skeletal structures are unremarkable. IMPRESSION: No active cardiopulmonary disease. Electronically Signed   By: Elige Ko   On: 09/16/2015 20:35    I personally reviewed and evaluated these lab results as a part of my medical decision-making.    EKG Interpretation   Date/Time:  Sunday September 16 2015 18:50:57 EST Ventricular Rate:  81 PR Interval:  158 QRS Duration: 84 QT Interval:  398 QTC Calculation: 462 R Axis:   27 Text Interpretation:  Normal sinus rhythm Normal ECG Confirmed by  Devere Brem  MD, Harrold Donath [phone]) on 09/16/2015 7:09:23 PM      MDM   Final diagnoses:  Chest pain, unspecified chest pain type    Patient with chest pain. EKG reassuring. Doubt cardiac cause. Has been taking increased NSAIDs which could saw some pain. Will discharge home. Doubt cardiac or pulmonary embolism as cause.  I personally performed the services described in this documentation, which was scribed in my presence. The recorded information has been reviewed and is accurate.      Benjiman Core, MD 09/17/15 [phone]

## 2016-01-25 ENCOUNTER — Encounter (HOSPITAL_BASED_OUTPATIENT_CLINIC_OR_DEPARTMENT_OTHER): Payer: Self-pay

## 2016-01-25 ENCOUNTER — Emergency Department (HOSPITAL_BASED_OUTPATIENT_CLINIC_OR_DEPARTMENT_OTHER)
Admission: EM | Admit: 2016-01-25 | Discharge: 2016-01-25 | Disposition: A | Discharge status: Home / Self Care | Payer: No Typology Code available for payment source | Attending: Emergency Medicine | Admitting: Emergency Medicine

## 2016-01-25 DIAGNOSIS — N390 Urinary tract infection, site not specified: Secondary | ICD-10-CM | POA: Insufficient documentation

## 2016-01-25 DIAGNOSIS — M1990 Unspecified osteoarthritis, unspecified site: Secondary | ICD-10-CM | POA: Insufficient documentation

## 2016-01-25 DIAGNOSIS — I10 Essential (primary) hypertension: Secondary | ICD-10-CM | POA: Insufficient documentation

## 2016-01-25 DIAGNOSIS — Z79899 Other long term (current) drug therapy: Secondary | ICD-10-CM | POA: Insufficient documentation

## 2016-01-25 LAB — URINALYSIS, ROUTINE W REFLEX MICROSCOPIC
BILIRUBIN URINE: NEGATIVE
Glucose, UA: NEGATIVE mg/dL
Hgb urine dipstick: NEGATIVE
KETONES UR: NEGATIVE mg/dL
Leukocytes, UA: NEGATIVE
NITRITE: POSITIVE — AB
PH: 6 (ref 5.0–8.0)
Protein, ur: NEGATIVE mg/dL
Specific Gravity, Urine: 1.023 (ref 1.005–1.030)

## 2016-01-25 LAB — URINE MICROSCOPIC-ADD ON
RBC / HPF: NONE SEEN RBC/hpf (ref 0–5)
WBC UA: NONE SEEN WBC/hpf (ref 0–5)

## 2016-01-25 MED ORDER — FOSFOMYCIN TROMETHAMINE 3 G PO PACK
3.0000 g | PACK | Freq: Once | ORAL | Status: AC
  Administered 2016-01-25: 3 g via ORAL
  Filled 2016-01-25: qty 3

## 2016-01-25 MED ORDER — PHENAZOPYRIDINE HCL 200 MG PO TABS: 200.0000 mg | ORAL_TABLET | Freq: Three times a day (TID) | ORAL | Status: AC

## 2016-01-25 NOTE — ED Notes (Signed)
Pt reports dysuria and suprapubic pain x 2 days.

## 2016-01-25 NOTE — ED Notes (Signed)
Pt c/o burning with urination and low abd pain x 2 days. Pt denies N/V, fever, vaginal discharge and back pain. Pt has taken OTC meds for UTI without relief.

## 2016-01-25 NOTE — ED Notes (Signed)
MD at bedside. 

## 2016-01-25 NOTE — ED Provider Notes (Signed)
CSN: [id number]     Arrival date & time 01/25/16  [id number] History   First MD Initiated Contact with Patient 01/25/16 [phone]     Chief Complaint  Patient presents with  . Dysuria     (Consider location/radiation/quality/duration/timing/severity/associated sxs/prior Treatment) Patient is a 55 y.o. female presenting with dysuria.  Dysuria Pain quality:  Aching Onset quality:  Gradual Duration:  2 days Timing:  Constant Progression:  Waxing and waning Chronicity:  New Recent urinary tract infections: no   Relieved by:  Nothing Ineffective treatments: over the counter urinary tract medicine. Associated symptoms: abdominal pain   Associated symptoms: no fever, no nausea, no vaginal discharge and no vomiting   Risk factors: no sexually transmitted infections     Past Medical History  Diagnosis Date  . Arthritis   . Hypertension    Past Surgical History  Procedure Laterality Date  . Cesarean section    . Tubal ligation     No family history on file. Social History  Substance Use Topics  . Smoking status: Never Smoker   . Smokeless tobacco: None  . Alcohol Use: No   OB History    No data available     Review of Systems  Constitutional: Negative for fever.  HENT: Negative for sore throat.   Eyes: Negative for visual disturbance.  Respiratory: Negative for cough and shortness of breath.   Cardiovascular: Negative for chest pain.  Gastrointestinal: Positive for abdominal pain. Negative for nausea, vomiting, diarrhea and constipation.  Genitourinary: Positive for dysuria. Negative for vaginal bleeding, vaginal discharge and difficulty urinating.  Musculoskeletal: Negative for back pain and neck pain.  Skin: Negative for rash.  Neurological: Negative for syncope and headaches.      Allergies  Review of patient's allergies indicates no known allergies.  Home Medications   Prior to Admission medications   Medication Sig Start Date End Date Taking? Authorizing Provider   gabapentin (NEURONTIN) 100 MG capsule Take 100 mg by mouth 3 (three) times daily.   Yes Historical Provider, MD  cyclobenzaprine (FLEXERIL) 10 MG tablet Take 1 tablet (10 mg total) by mouth 2 (two) times daily as needed for muscle spasms. 09/16/15   Benjiman CoreNathan Pickering, MD  lisinopril-hydrochlorothiazide (PRINZIDE,ZESTORETIC) 20-12.5 MG per tablet Take 2 tablets by mouth daily.    Historical Provider, MD  omeprazole (PRILOSEC) 20 MG capsule Take 1 capsule (20 mg total) by mouth daily. 09/16/15   Benjiman CoreNathan Pickering, MD  phenazopyridine (PYRIDIUM) 200 MG tablet Take 1 tablet (200 mg total) by mouth 3 (three) times daily. 01/25/16 01/26/16  Alvira MondayErin Lorie Melichar, MD   BP 125/61 mmHg  Pulse 70  Temp(Src) 98.7 F (37.1 C) (Oral)  Resp 18  Ht 5\' 3"  (1.6 m)  Wt 260 lb (117.935 kg)  BMI 46.07 kg/m2  SpO2 96%  LMP 07/25/2013 Physical Exam  Constitutional: She is oriented to person, place, and time. She appears well-developed and well-nourished. No distress.  HENT:  Head: Normocephalic and atraumatic.  Eyes: Conjunctivae and EOM are normal.  Neck: Normal range of motion.  Cardiovascular: Normal rate, regular rhythm, normal heart sounds and intact distal pulses.  Exam reveals no gallop and no friction rub.   No murmur heard. Pulmonary/Chest: Effort normal and breath sounds normal. No respiratory distress. She has no wheezes. She has no rales.  Abdominal: Soft. She exhibits no distension. There is tenderness (mild) in the suprapubic area. There is no guarding, no CVA tenderness and no tenderness at McBurney's point.  Musculoskeletal: She exhibits no edema.  Lumbar back: She exhibits tenderness (lower back).  Neurological: She is alert and oriented to person, place, and time.  Skin: Skin is warm and dry. No rash noted. She is not diaphoretic. No erythema.  Nursing note and vitals reviewed.   ED Course  Procedures (including critical care time) Labs Review Labs Reviewed  URINALYSIS, ROUTINE W REFLEX  MICROSCOPIC (NOT AT [facility]) - Abnormal; Notable for the following:    Color, Urine ORANGE (*)    Nitrite POSITIVE (*)    All other components within normal limits  URINE MICROSCOPIC-ADD ON - Abnormal; Notable for the following:    Squamous Epithelial / LPF 6-30 (*)    Bacteria, UA MANY (*)    All other components within normal limits  URINE CULTURE    Imaging Review No results found. I have personally reviewed and evaluated these images and lab results as part of my medical decision-making.   EKG Interpretation None      MDM   Final diagnoses:  UTI (lower urinary tract infection)   55 year old female with history of hypertension presents with concern of suprapubic abdominal pain and dysuria.  Abdominal exam is benign of low suspicion for appendicitis, diverticulitis or other. History does not suggest other pelvic infection.  No history to suggest pyelonephritis. Has sciatica pain for which she is seeing PCP. No increased frequency, doubt DM. Urinalysis shows positive nitrites and many bacteria.  There are no WBCs, and possible that nitrites are from OTC medication--however, given symptoms with many bacteria and nitrites, suspect UTI. Given lower urinary symptoms without complication, will treat with one time dose of fosfomycin and will send for culture.  Recommend PCP follow up. Discussed reasons to return.    Alvira Monday, MD 01/25/16 [phone]

## 2016-01-26 LAB — URINE CULTURE

## 2016-01-29 ENCOUNTER — Emergency Department (HOSPITAL_BASED_OUTPATIENT_CLINIC_OR_DEPARTMENT_OTHER)
Admission: EM | Admit: 2016-01-29 | Discharge: 2016-01-29 | Disposition: A | Discharge status: Home / Self Care | Payer: No Typology Code available for payment source | Attending: Emergency Medicine | Admitting: Emergency Medicine

## 2016-01-29 ENCOUNTER — Encounter (HOSPITAL_BASED_OUTPATIENT_CLINIC_OR_DEPARTMENT_OTHER): Payer: Self-pay | Admitting: *Deleted

## 2016-01-29 DIAGNOSIS — N390 Urinary tract infection, site not specified: Secondary | ICD-10-CM | POA: Insufficient documentation

## 2016-01-29 DIAGNOSIS — R300 Dysuria: Secondary | ICD-10-CM

## 2016-01-29 DIAGNOSIS — Z79899 Other long term (current) drug therapy: Secondary | ICD-10-CM | POA: Insufficient documentation

## 2016-01-29 DIAGNOSIS — I10 Essential (primary) hypertension: Secondary | ICD-10-CM | POA: Insufficient documentation

## 2016-01-29 LAB — URINALYSIS, ROUTINE W REFLEX MICROSCOPIC
BILIRUBIN URINE: NEGATIVE
Glucose, UA: NEGATIVE mg/dL
HGB URINE DIPSTICK: NEGATIVE
KETONES UR: NEGATIVE mg/dL
Leukocytes, UA: NEGATIVE
NITRITE: POSITIVE — AB
PH: 6 (ref 5.0–8.0)
Protein, ur: NEGATIVE mg/dL
SPECIFIC GRAVITY, URINE: 1.022 (ref 1.005–1.030)

## 2016-01-29 LAB — URINE MICROSCOPIC-ADD ON: RBC / HPF: NONE SEEN RBC/hpf (ref 0–5)

## 2016-01-29 MED ORDER — CEPHALEXIN 500 MG PO CAPS: 500.0000 mg | ORAL_CAPSULE | Freq: Two times a day (BID) | ORAL | Status: DC

## 2016-01-29 MED FILL — CEPHALEXIN 500 MG CAPSULE: 500 | 7 days supply | Qty: 14 | Fill #0

## 2016-01-29 NOTE — ED Notes (Signed)
Pt seen here x 3 days ago DX UTI, here today for same

## 2016-01-29 NOTE — Discharge Instructions (Signed)
Take home pyridium for next 2-3 days. Take antibiotics as prescribed. Return for worsening symptoms, including worsening pain, flank pain, fever, vomiting and unable to keep down food/fluids, or any other symptoms concerning to you. Follow-up with your PCP in 1 week.  Dysuria Dysuria is pain or discomfort while urinating. The pain or discomfort may be felt in the tube that carries urine out of the bladder (urethra) or in the surrounding tissue of the genitals. The pain may also be felt in the groin area, lower abdomen, and lower back. You may have to urinate frequently or have the sudden feeling that you have to urinate (urgency). Dysuria can affect both men and women, but is more common in women. Dysuria can be caused by many different things, including:  Urinary tract infection in women.  Infection of the kidney or bladder.  Kidney stones or bladder stones.  Certain sexually transmitted infections (STIs), such as chlamydia.  Dehydration.  Inflammation of the vagina.  Use of certain medicines.  Use of certain soaps or scented products that cause irritation. HOME CARE INSTRUCTIONS Watch your dysuria for any changes. The following actions may help to reduce any discomfort you are feeling:  Drink enough fluid to keep your urine clear or pale yellow.  Empty your bladder often. Avoid holding urine for long periods of time.  After a bowel movement or urination, women should cleanse from front to back, using each tissue only once.  Empty your bladder after sexual intercourse.  Take medicines only as directed by your health care provider.  If you were prescribed an antibiotic medicine, finish it all even if you start to feel better.  Avoid caffeine, tea, and alcohol. They can irritate the bladder and make dysuria worse. In men, alcohol may irritate the prostate.  Keep all follow-up visits as directed by your health care provider. This is important.  If you had any tests done to  find the cause of dysuria, it is your responsibility to obtain your test results. Ask the lab or department performing the test when and how you will get your results. Talk with your health care provider if you have any questions about your results. SEEK MEDICAL CARE IF:  You develop pain in your back or sides.  You have a fever.  You have nausea or vomiting.  You have blood in your urine.  You are not urinating as often as you usually do. SEEK IMMEDIATE MEDICAL CARE IF:  You pain is severe and not relieved with medicines.  You are unable to hold down any fluids.  You or someone else notices a change in your mental function.  You have a rapid heartbeat at rest.  You have shaking or chills.  You feel extremely weak.   This information is not intended to replace advice given to you by your health care provider. Make sure you discuss any questions you have with your health care provider.   Document Released: 04/25/2004 Document Revised: 08/18/2014 Document Reviewed: 03/23/2014 Elsevier Interactive Patient Education 2016 Else[employer] Inc[employer]   Urinary Tract Infection Urinary tract infections (UTIs) can develop anywhere along your urinary tract. Your urinary tract is your body's drainage system for removing wastes and extra water. Your urinary tract includes two kidneys, two ureters, a bladder, and a urethra. Your kidneys are a pair of bean-shaped organs. Each kidney is about the size of your fist. They are located below your ribs, one on each side of your spine. CAUSES Infections are caused by microbes, which are  microscopic organisms, including fungi, viruses, and bacteria. These organisms are so small that they can only be seen through a microscope. Bacteria are the microbes that most commonly cause UTIs. SYMPTOMS  Symptoms of UTIs may vary by age and gender of the patient and by the location of the infection. Symptoms in young women typically include a frequent and intense urge to  urinate and a painful, burning feeling in the bladder or urethra during urination. Older women and men are more likely to be tired, shaky, and weak and have muscle aches and abdominal pain. A fever may mean the infection is in your kidneys. Other symptoms of a kidney infection include pain in your back or sides below the ribs, nausea, and vomiting. DIAGNOSIS To diagnose a UTI, your caregiver will ask you about your symptoms. Your caregiver will also ask you to provide a urine sample. The urine sample will be tested for bacteria and white blood cells. White blood cells are made by your body to help fight infection. TREATMENT  Typically, UTIs can be treated with medication. Because most UTIs are caused by a bacterial infection, they usually can be treated with the use of antibiotics. The choice of antibiotic and length of treatment depend on your symptoms and the type of bacteria causing your infection. HOME CARE INSTRUCTIONS  If you were prescribed antibiotics, take them exactly as your caregiver instructs you. Finish the medication even if you feel better after you have only taken some of the medication.  Drink enough water and fluids to keep your urine clear or pale yellow.  Avoid caffeine, tea, and carbonated beverages. They tend to irritate your bladder.  Empty your bladder often. Avoid holding urine for long periods of time.  Empty your bladder before and after sexual intercourse.  After a bowel movement, women should cleanse from front to back. Use each tissue only once. SEEK MEDICAL CARE IF:   You have back pain.  You develop a fever.  Your symptoms do not begin to resolve within 3 days. SEEK IMMEDIATE MEDICAL CARE IF:   You have severe back pain or lower abdominal pain.  You develop chills.  You have nausea or vomiting.  You have continued burning or discomfort with urination. MAKE SURE YOU:   Understand these instructions.  Will watch your condition.  Will get help  right away if you are not doing well or get worse.   This information is not intended to replace advice given to you by your health care provider. Make sure you discuss any questions you have with your health care provider.   Document Released: 05/07/2005 Document Revised: 04/18/2015 Document Reviewed: 09/05/2011 Elsevier Interactive Patient Education [employer]

## 2016-01-29 NOTE — ED Provider Notes (Signed)
CSN: [id number]     Arrival date & time 01/29/16  0729 History   First MD Initiated Contact with Patient 01/29/16 [phone]     Chief Complaint  Patient presents with  . Dysuria     (Consider location/radiation/quality/duration/timing/severity/associated sxs/prior Treatment) HPI 55 year old female who presents with dysuria. History of HTN. Seen in ED 3-4 days ago with dysuria. Treated for UTI with fosfomycin, and states that symptoms did initially resolve but she had recurrent dysuria yesterday evening with suprapubic pressure. No fever, chills, flank pain, nausea, vomiting, abnormal vaginal bleeding or discharge. No urinary frequency, retention, or hesitancy. No concern for sexually transmitted illnesses.   Past Medical History  Diagnosis Date  . Arthritis   . Hypertension    Past Surgical History  Procedure Laterality Date  . Cesarean section    . Tubal ligation     History reviewed. No pertinent family history. Social History  Substance Use Topics  . Smoking status: Never Smoker   . Smokeless tobacco: None  . Alcohol Use: No   OB History    No data available     Review of Systems 10/14 systems reviewed and are negative other than those stated in the HPI    Allergies  Review of patient's allergies indicates no known allergies.  Home Medications   Prior to Admission medications   Medication Sig Start Date End Date Taking? Authorizing Provider  cephALEXin (KEFLEX) 500 MG capsule Take 1 capsule (500 mg total) by mouth 2 (two) times daily. 01/29/16   Lavera Guiseana Duo Copper Kirtley, MD  cyclobenzaprine (FLEXERIL) 10 MG tablet Take 1 tablet (10 mg total) by mouth 2 (two) times daily as needed for muscle spasms. 09/16/15   Benjiman CoreNathan Pickering, MD  gabapentin (NEURONTIN) 100 MG capsule Take 100 mg by mouth 3 (three) times daily.    Historical Provider, MD  lisinopril-hydrochlorothiazide (PRINZIDE,ZESTORETIC) 20-12.5 MG per tablet Take 2 tablets by mouth daily.    Historical Provider, MD  omeprazole  (PRILOSEC) 20 MG capsule Take 1 capsule (20 mg total) by mouth daily. 09/16/15   Benjiman CoreNathan Pickering, MD   BP 132/62 mmHg  Pulse 70  Temp(Src) 98.6 F (37 C) (Oral)  Resp 18  Ht 5\' 3"  (1.6 m)  Wt 260 lb (117.935 kg)  BMI 46.07 kg/m2  SpO2 98%  LMP 07/25/2013 Physical Exam Physical Exam  Nursing note and vitals reviewed. Constitutional: Well developed, well nourished, non-toxic, and in no acute distress Head: Normocephalic and atraumatic.  Mouth/Throat: Moist mucous membranes. Neck: Normal range of motion. Neck supple.  Cardiovascular: Normal rate and regular rhythm.   Pulmonary/Chest: Effort normal and breath sounds normal.  Abdominal: Soft. There is mild suprapubic tenderness. No tenderness at McBurney's point. There is no rebound and no guarding. No CVA tenderness. Musculoskeletal: Normal range of motion.  Neurological: Alert, no facial droop, fluent speech, moves all extremities symmetrically Skin: Skin is warm and dry.  Psychiatric: Cooperative  ED Course  Procedures (including critical care time) Labs Review Labs Reviewed  URINALYSIS, ROUTINE W REFLEX MICROSCOPIC (NOT AT [facility]) - Abnormal; Notable for the following:    Color, Urine ORANGE (*)    Nitrite POSITIVE (*)    All other components within normal limits  URINE MICROSCOPIC-ADD ON - Abnormal; Notable for the following:    Squamous Epithelial / LPF 6-30 (*)    Bacteria, UA MANY (*)    All other components within normal limits  URINE CULTURE    Imaging Review No results found. I have personally reviewed  and evaluated these images and lab results as part of my medical decision-making.   EKG Interpretation None      MDM   Final diagnoses:  Dysuria  UTI (lower urinary tract infection)    Presenting with recurrent dysuria and suprapubic pressure after recent treatment for UTI. Urine culture from 4 days ago reviewed. Growing multiple species, recommending recollection. She is afebrile, hemodynamically stable,  and has a benign abdomen. With some mild suprapubic tenderness over her bladder. No concern for pyelonephritis. Exam and history not c/f appendicitis, diverticulitis, or other pelvic or intraabdominal infection at this time. UA with nitrites and bacteria, similar to prior UA. Sent for culture again. Will treat with course of keflex. Discussed close PCP f/u and strict return instructions.    Lavera Guise, MD 01/29/16 [phone]

## 2016-01-30 LAB — URINE CULTURE

## 2016-02-02 ENCOUNTER — Emergency Department (HOSPITAL_BASED_OUTPATIENT_CLINIC_OR_DEPARTMENT_OTHER): Payer: Self-pay

## 2016-02-02 ENCOUNTER — Encounter (HOSPITAL_BASED_OUTPATIENT_CLINIC_OR_DEPARTMENT_OTHER): Payer: Self-pay | Admitting: *Deleted

## 2016-02-02 ENCOUNTER — Emergency Department (HOSPITAL_BASED_OUTPATIENT_CLINIC_OR_DEPARTMENT_OTHER)
Admission: EM | Admit: 2016-02-02 | Discharge: 2016-02-02 | Disposition: A | Discharge status: Home / Self Care | Payer: Self-pay | Attending: Emergency Medicine | Admitting: Emergency Medicine

## 2016-02-02 DIAGNOSIS — M549 Dorsalgia, unspecified: Secondary | ICD-10-CM | POA: Insufficient documentation

## 2016-02-02 DIAGNOSIS — R1030 Lower abdominal pain, unspecified: Secondary | ICD-10-CM | POA: Insufficient documentation

## 2016-02-02 DIAGNOSIS — I10 Essential (primary) hypertension: Secondary | ICD-10-CM | POA: Insufficient documentation

## 2016-02-02 DIAGNOSIS — R300 Dysuria: Secondary | ICD-10-CM

## 2016-02-02 DIAGNOSIS — R109 Unspecified abdominal pain: Secondary | ICD-10-CM

## 2016-02-02 DIAGNOSIS — Z79899 Other long term (current) drug therapy: Secondary | ICD-10-CM | POA: Insufficient documentation

## 2016-02-02 DIAGNOSIS — M1990 Unspecified osteoarthritis, unspecified site: Secondary | ICD-10-CM | POA: Insufficient documentation

## 2016-02-02 LAB — URINALYSIS, ROUTINE W REFLEX MICROSCOPIC
BILIRUBIN URINE: NEGATIVE
Glucose, UA: NEGATIVE mg/dL
Hgb urine dipstick: NEGATIVE
Ketones, ur: NEGATIVE mg/dL
Leukocytes, UA: NEGATIVE
NITRITE: NEGATIVE
Protein, ur: NEGATIVE mg/dL
SPECIFIC GRAVITY, URINE: 1.028 (ref 1.005–1.030)
pH: 5.5 (ref 5.0–8.0)

## 2016-02-02 LAB — WET PREP, GENITAL
CLUE CELLS WET PREP: NONE SEEN
SPERM: NONE SEEN
Trich, Wet Prep: NONE SEEN
YEAST WET PREP: NONE SEEN

## 2016-02-02 LAB — CBG MONITORING, ED: GLUCOSE-CAPILLARY: 96 mg/dL (ref 65–99)

## 2016-02-02 MED ORDER — METHYLPREDNISOLONE 4 MG PO TBPK: ORAL_TABLET | ORAL | Status: DC

## 2016-02-02 MED ORDER — IBUPROFEN 800 MG PO TABS: 800.0000 mg | ORAL_TABLET | Freq: Three times a day (TID) | ORAL | Status: DC

## 2016-02-02 NOTE — ED Provider Notes (Signed)
CSN: [id number]     Arrival date & time 02/02/16  1351 History  By signing my name below, I, Freida Busmaniana Omoyeni, attest that this documentation has been prepared under the direction and in the presence of Glynn OctaveStephen Tarris Delbene, MD . Electronically Signed: Freida Busmaniana Omoyeni, Scribe. 02/02/2016. 7:45 PM.    Chief Complaint  Patient presents with  . Urinary Tract Infection   The history is provided by the patient and medical records. No language interpreter was used.     HPI Comments:  Barbara Christensen is a 55 y.o. female with a history of sciatica, who presents to the Emergency Department complaining of moderate, suprapubic abdominal pain that radiates into her back x ~ 1 week. She notes pain started in left flank area ~ 3 days ago. She also notes some radiation of pain down her LLE. Pt reports associated pain after urinating.  Pt was seen in the ED on 01/29/16 and 6/16 for dysuria and was diagnosed with UTI. She was discharged with keflex which she has been taking with little relief. She denies hematuria, vaginal discharge, diarrhea, constipation, rash, fever, dizziness/lightheadedness, bowel/bladder incontinence,  CP and SOB. Urine cultures on 6/16 and 6/20 grew out multiple species.    Past Medical History  Diagnosis Date  . Arthritis   . Hypertension    Past Surgical History  Procedure Laterality Date  . Cesarean section    . Tubal ligation     No family history on file. Social History  Substance Use Topics  . Smoking status: Never Smoker   . Smokeless tobacco: None  . Alcohol Use: No   OB History    No data available     Review of Systems  Constitutional: Negative for fever and chills.  Respiratory: Negative for shortness of breath.   Cardiovascular: Negative for chest pain.  Gastrointestinal: Positive for abdominal pain. Negative for diarrhea and constipation.  Genitourinary: Positive for dysuria. Negative for hematuria, vaginal bleeding, vaginal discharge and difficulty urinating.   Musculoskeletal: Positive for back pain.  Skin: Negative for rash.  Neurological: Negative for dizziness and light-headedness.   Allergies  Review of patient's allergies indicates no known allergies.  Home Medications   Prior to Admission medications   Medication Sig Start Date End Date Taking? Authorizing Provider  cephALEXin (KEFLEX) 500 MG capsule Take 1 capsule (500 mg total) by mouth 2 (two) times daily. 01/29/16   Lavera Guiseana Duo Liu, MD  cyclobenzaprine (FLEXERIL) 10 MG tablet Take 1 tablet (10 mg total) by mouth 2 (two) times daily as needed for muscle spasms. 09/16/15   Benjiman CoreNathan Pickering, MD  gabapentin (NEURONTIN) 100 MG capsule Take 100 mg by mouth 3 (three) times daily.    Historical Provider, MD  lisinopril-hydrochlorothiazide (PRINZIDE,ZESTORETIC) 20-12.5 MG per tablet Take 2 tablets by mouth daily.    Historical Provider, MD  omeprazole (PRILOSEC) 20 MG capsule Take 1 capsule (20 mg total) by mouth daily. 09/16/15   Benjiman CoreNathan Pickering, MD   BP 142/61 mmHg  Pulse 70  Temp(Src) 98.7 F (37.1 C) (Oral)  Resp 18  Ht 5\' 3"  (1.6 m)  Wt 260 lb (117.935 kg)  BMI 46.07 kg/m2  SpO2 98%  LMP 07/25/2013 Physical Exam  Constitutional: She is oriented to person, place, and time. She appears well-developed and well-nourished. No distress.  HENT:  Head: Normocephalic and atraumatic.  Mouth/Throat: Oropharynx is clear and moist. No oropharyngeal exudate.  Eyes: Conjunctivae and EOM are normal. Pupils are equal, round, and reactive to light.  Neck: Normal range of  motion. Neck supple.  No meningismus.  Cardiovascular: Normal rate, regular rhythm, normal heart sounds and intact distal pulses.   No murmur heard. Pulmonary/Chest: Effort normal and breath sounds normal. No respiratory distress.  Abdominal: Soft. There is tenderness. There is no rebound and no guarding.  Left abdominal tenderness  Genitourinary: Vagina normal.  No CMT or adenexal tenderness Scant white discharge Mild suprapubic  tenderness noted Chaperone was present for exam which was performed with no discomfort or complications.   Musculoskeletal: Normal range of motion. She exhibits tenderness. She exhibits no edema.  Left paraspinal lumbar tenderness; no midline tenderness 5/5 strength in bilateral lower extremities. Ankle plantar and dorsiflexion intact. Great toe extension intact bilaterally. +2 DP and PT pulses. +2 patellar reflexes bilaterally. Normal gait.  Neurological: She is alert and oriented to person, place, and time. No cranial nerve deficit. She exhibits normal muscle tone. Coordination normal.  No ataxia on finger to nose bilaterally. No pronator drift. 5/5 strength throughout. CN 2-12 intact.Equal grip strength. Sensation intact.   Skin: Skin is warm.  Psychiatric: She has a normal mood and affect. Her behavior is normal.  Nursing note and vitals reviewed.   ED Course  Procedures DIAGNOSTIC STUDIES:  Oxygen Saturation is 98% on RA, normal by my interpretation.    COORDINATION OF CARE:  5:45 PM Discussed treatment plan with pt at bedside and pt agreed to plan.  Labs Review Labs Reviewed  WET PREP, GENITAL - Abnormal; Notable for the following:    WBC, Wet Prep HPF POC MODERATE (*)    All other components within normal limits  URINE CULTURE  URINALYSIS, ROUTINE W REFLEX MICROSCOPIC (NOT AT [facility])  CBG MONITORING, ED  GC/CHLAMYDIA PROBE AMP ([place]) NOT AT [facility]    Imaging Review No results found. I have personally reviewed and evaluated these images and lab results as part of my medical decision-making.   EKG Interpretation None      MDM   Final diagnoses:  Flank pain  Dysuria   Ongoing urinary symptoms x1 week. Seen here x2.  Now with L flank and abdominal pain. No vaginal symptoms.  Urinalysis today is negative. Urine cultures did not grow any specific bacteria.  No motor or sensory deficits.  CT negative for stone or other acute pathology. Pelvic exam benign. CBG  normal.  May have passed kidney stone.  Continue keflex for UTI. Treat also for possible radiculopathy.   I personally performed the services described in this documentation, which was scribed in my presence. The recorded information has been reviewed and is accurate.    Glynn OctaveStephen Heath Badon, MD 02/03/16 [phone]

## 2016-02-02 NOTE — ED Notes (Signed)
Pt was seen here and diagnosed with UTI, she was placed on antibiotics which she is taking but her symptoms have bcome more, she is reporting burning after urination as well as lower abdominal pain and flank pain.

## 2016-02-02 NOTE — Discharge Instructions (Signed)
Flank Pain °Flank pain refers to pain that is located on the side of the body between the upper abdomen and the back. The pain may occur over a short period of time (acute) or may be long-term or reoccurring (chronic). It may be mild or severe. Flank pain can be caused by many things. °CAUSES  °Some of the more common causes of flank pain include: °· Muscle strains.   °· Muscle spasms.   °· A disease of your spine (vertebral disk disease).   °· A lung infection (pneumonia).   °· Fluid around your lungs (pulmonary edema).   °· A kidney infection.   °· Kidney stones.   °· A very painful skin rash caused by the chickenpox virus (shingles).   °· Gallbladder disease.   °HOME CARE INSTRUCTIONS  °Home care will depend on the cause of your pain. In general, °· Rest as directed by your caregiver. °· Drink enough fluids to keep your urine clear or pale yellow. °· Only take over-the-counter or prescription medicines as directed by your caregiver. Some medicines may help relieve the pain. °· Tell your caregiver about any changes in your pain. °· Follow up with your caregiver as directed. °SEEK IMMEDIATE MEDICAL CARE IF:  °· Your pain is not controlled with medicine.   °· You have new or worsening symptoms. °· Your pain increases.   °· You have abdominal pain.   °· You have shortness of breath.   °· You have persistent nausea or vomiting.   °· You have swelling in your abdomen.   °· You feel faint or pass out.   °· You have blood in your urine. °· You have a fever or persistent symptoms for more than 2-3 days. °· You have a fever and your symptoms suddenly get worse. °MAKE SURE YOU:  °· Understand these instructions. °· Will watch your condition. °· Will get help right away if you are not doing well or get worse. °  °This information is not intended to replace advice given to you by your health care provider. Make sure you discuss any questions you have with your health care provider. °  °Document Released: 09/18/2005 Document  Revised: 04/21/2012 Document Reviewed: 03/11/2012 °Elsevier Interactive Patient Education ©2016 [employer] Inc. ° °

## 2016-02-04 LAB — URINE CULTURE

## 2016-02-04 LAB — GC/CHLAMYDIA PROBE AMP (~~LOC~~) NOT AT ARMC
Chlamydia: NEGATIVE
Neisseria Gonorrhea: NEGATIVE

## 2016-11-10 ENCOUNTER — Emergency Department (HOSPITAL_BASED_OUTPATIENT_CLINIC_OR_DEPARTMENT_OTHER)
Admission: EM | Admit: 2016-11-10 | Discharge: 2016-11-10 | Disposition: A | Discharge status: Home / Self Care | Payer: 59 | Attending: Emergency Medicine | Admitting: Emergency Medicine

## 2016-11-10 ENCOUNTER — Encounter (HOSPITAL_BASED_OUTPATIENT_CLINIC_OR_DEPARTMENT_OTHER): Payer: Self-pay | Admitting: *Deleted

## 2016-11-10 DIAGNOSIS — Z79899 Other long term (current) drug therapy: Secondary | ICD-10-CM | POA: Insufficient documentation

## 2016-11-10 DIAGNOSIS — J209 Acute bronchitis, unspecified: Secondary | ICD-10-CM | POA: Insufficient documentation

## 2016-11-10 DIAGNOSIS — I10 Essential (primary) hypertension: Secondary | ICD-10-CM | POA: Diagnosis not present

## 2016-11-10 DIAGNOSIS — R05 Cough: Secondary | ICD-10-CM | POA: Diagnosis present

## 2016-11-10 MED ORDER — ALBUTEROL SULFATE HFA 108 (90 BASE) MCG/ACT IN AERS
2.0000 | INHALATION_SPRAY | RESPIRATORY_TRACT | Status: DC | PRN
  Administered 2016-11-10: 2 via RESPIRATORY_TRACT
  Filled 2016-11-10: qty 6.7

## 2016-11-10 MED ORDER — HYDROCOD POLST-CPM POLST ER 10-8 MG/5ML PO SUER: 5.0000 mL | Freq: Two times a day (BID) | ORAL | 0 refills | Status: DC | PRN

## 2016-11-10 NOTE — ED Notes (Signed)
ED Provider at bedside. 

## 2016-11-10 NOTE — ED Triage Notes (Signed)
Pt states she was seen by PCP last Wed for cough, congestion. Given steroid shot which she feels made her worse. Has had worsening cough since and is unable to cough anything up. Throat feels irritated from coughing. Denies other s/s.

## 2016-11-10 NOTE — ED Provider Notes (Signed)
[facility] Provider Note: Lowella Dell, MD, FACEP  CSN: [id number] MRN: [id number] ARRIVAL: 11/10/16 at 0146 ROOM: [facility]   CHIEF COMPLAINT  Cough   HISTORY OF PRESENT ILLNESS  Barbara Christensen is a 56 y.o. female who was given a steroid shot 5 days ago for sinus congestion. The sinus congestion improved subsequently. Since then however she has developed a cough. The cough comes in paroxysms which are severe at times. She has taken Robitussin without relief. She denies shortness of breath, chest pain, fever or chills. She does have some discomfort in her throat which she attributes to the cough. She states it feels like phlegm is stuck in her throat and she can't cough it up.   Past Medical History:  Diagnosis Date  . Arthritis   . Hypertension     Past Surgical History:  Procedure Laterality Date  . CESAREAN SECTION    . TUBAL LIGATION      History reviewed. No pertinent family history.  Social History  Substance Use Topics  . Smoking status: Never Smoker  . Smokeless tobacco: Never Used  . Alcohol use No    Prior to Admission medications   Medication Sig Start Date End Date Taking? Authorizing Provider  losartan (COZAAR) 100 MG tablet Take 100 mg by mouth daily.   Yes Historical Provider, MD  cephALEXin (KEFLEX) 500 MG capsule Take 1 capsule (500 mg total) by mouth 2 (two) times daily. 01/29/16   Lavera Guise, MD  cyclobenzaprine (FLEXERIL) 10 MG tablet Take 1 tablet (10 mg total) by mouth 2 (two) times daily as needed for muscle spasms. 09/16/15   Benjiman Core, MD  gabapentin (NEURONTIN) 100 MG capsule Take 100 mg by mouth 3 (three) times daily.    Historical Provider, MD  ibuprofen (ADVIL,MOTRIN) 800 MG tablet Take 1 tablet (800 mg total) by mouth 3 (three) times daily. 02/02/16   Glynn Octave, MD  lisinopril-hydrochlorothiazide (PRINZIDE,ZESTORETIC) 20-12.5 MG per tablet Take 2 tablets by mouth daily.    Historical Provider, MD  methylPREDNISolone  (MEDROL DOSEPAK) 4 MG TBPK tablet As directed 02/02/16   Glynn Octave, MD  omeprazole (PRILOSEC) 20 MG capsule Take 1 capsule (20 mg total) by mouth daily. 09/16/15   Benjiman Core, MD    Allergies Patient has no known allergies.   REVIEW OF SYSTEMS  Negative except as noted here or in the History of Present Illness.   PHYSICAL EXAMINATION  Initial Vital Signs Blood pressure (!) 157/73, pulse 82, temperature 98.4 F (36.9 C), temperature source Oral, resp. rate 20, height  (1.626 m), weight 264 lb (119.7 kg), last menstrual period 07/25/2013, SpO2 98 %.  Examination General: Well-developed, well-nourished female in no acute distress; appearance consistent with age of record HENT: normocephalic; atraumatic; pharynx normal Eyes: pupils equal, round and reactive to light; extraocular muscles intact Neck: supple Heart: regular rate and rhythm Lungs: Mildly decreased air movement bilaterally Abdomen: soft; nondistended; nontender; bowel sounds present Extremities: No deformity; full range of motion; pulses normal Neurologic: Awake, alert and oriented; motor function intact in all extremities and symmetric; no facial droop Skin: Warm and dry Psychiatric: Normal mood and affect   RESULTS  Summary of this visit's results, reviewed by myself:   EKG Interpretation  Date/Time:    Ventricular Rate:    PR Interval:    QRS Duration:   QT Interval:    QTC Calculation:   R Axis:     Text Interpretation:  Laboratory Studies: No results found for this or any previous visit (from the past 24 hour(s)). Imaging Studies: No results found.  ED COURSE  Nursing notes and initial vitals signs, including pulse oximetry, reviewed.  Vitals:   11/10/16 0152 11/10/16 0215  BP: (!) 157/73   Pulse: 82   Resp: 20   Temp: 98.4 F (36.9 C)   TempSrc: Oral   SpO2: 98% 99%  Weight: 264 lb (119.7 kg)   Height:  (1.626 m)    2:25 AM Feels better after albuterol  treatment.   PROCEDURES    ED DIAGNOSES     ICD-9-CM ICD-10-CM   1. Acute bronchitis with bronchospasm 466.0 J20.9        Paula Libra, MD 11/10/16 0225

## 2016-12-20 ENCOUNTER — Encounter (HOSPITAL_BASED_OUTPATIENT_CLINIC_OR_DEPARTMENT_OTHER): Payer: Self-pay

## 2016-12-20 ENCOUNTER — Emergency Department (HOSPITAL_BASED_OUTPATIENT_CLINIC_OR_DEPARTMENT_OTHER)
Admission: EM | Admit: 2016-12-20 | Discharge: 2016-12-20 | Disposition: A | Discharge status: Home / Self Care | Payer: 59 | Attending: Emergency Medicine | Admitting: Emergency Medicine

## 2016-12-20 DIAGNOSIS — Z79899 Other long term (current) drug therapy: Secondary | ICD-10-CM | POA: Insufficient documentation

## 2016-12-20 DIAGNOSIS — R220 Localized swelling, mass and lump, head: Secondary | ICD-10-CM | POA: Diagnosis present

## 2016-12-20 DIAGNOSIS — T783XXA Angioneurotic edema, initial encounter: Secondary | ICD-10-CM | POA: Diagnosis not present

## 2016-12-20 DIAGNOSIS — I10 Essential (primary) hypertension: Secondary | ICD-10-CM | POA: Diagnosis not present

## 2016-12-20 HISTORY — DX: Dorsalgia, unspecified: M54.9

## 2016-12-20 MED ORDER — METHYLPREDNISOLONE SODIUM SUCC 125 MG IJ SOLR
125.0000 mg | Freq: Once | INTRAMUSCULAR | Status: AC
  Administered 2016-12-20: 125 mg via INTRAVENOUS
  Filled 2016-12-20: qty 2

## 2016-12-20 MED ORDER — HYDROCHLOROTHIAZIDE 12.5 MG PO TABS: 25.0000 mg | ORAL_TABLET | Freq: Every day | ORAL | 1 refills | Status: DC

## 2016-12-20 MED ORDER — DIPHENHYDRAMINE HCL 50 MG/ML IJ SOLN
25.0000 mg | Freq: Once | INTRAMUSCULAR | Status: AC
  Administered 2016-12-20: 25 mg via INTRAVENOUS
  Filled 2016-12-20: qty 1

## 2016-12-20 MED ORDER — METOPROLOL SUCCINATE ER 25 MG PO TB24: 25.0000 mg | ORAL_TABLET | Freq: Every day | ORAL | 0 refills | Status: AC

## 2016-12-20 NOTE — ED Provider Notes (Signed)
[facility] Provider Note   CSN: [id number] Arrival date & time: 12/20/16  2038  By signing my name below, I, Barbara Christensen, attest that this documentation has been prepared under the direction and in the presence of Rolland PorterJames, Zanyiah Posten, MD  Electronically Signed: Thelma BargeNick Christensen, Scribe. 12/20/16. 9:24 PM.  History   Chief Complaint Chief Complaint  Patient presents with  . Angioedema   The history is provided by the patient. No language interpreter was used.    HPI Comments: Barbara Christensen is a 56 y.o. female who presents to the Emergency Department complaining of a rapid-onset, swollen upper lip that occurred 4 hours ago. She states she went to [employer] and ate chocolate ice cream and her upper lip started swelling. This has not happened previously. She notes she has been on Gabopin for 2 years and lisinopril 40mg  for 10+ years. She has a PSHx of spinal tap injection for her back problems. She took benadryl at home with mild relief.   Past Medical History:  Diagnosis Date  . Arthritis   . Back pain   . Hypertension     There are no active problems to display for this patient.   Past Surgical History:  Procedure Laterality Date  . CESAREAN SECTION    . TUBAL LIGATION      OB History    No data available       Home Medications    Prior to Admission medications   Medication Sig Start Date End Date Taking? Authorizing Provider  gabapentin (NEURONTIN) 100 MG capsule Take 100 mg by mouth 3 (three) times daily.   Yes [provider]  lisinopril-hydrochlorothiazide (PRINZIDE,ZESTORETIC) 20-12.5 MG tablet Take 1 tablet by mouth 2 (two) times daily.   Yes [provider]  chlorpheniramine-HYDROcodone (TUSSIONEX PENNKINETIC ER) 10-8 MG/5ML SUER Take 5 mLs by mouth every 12 (twelve) hours as needed. 11/10/16   Molpus, John, MD  cyclobenzaprine (FLEXERIL) 10 MG tablet Take 1 tablet (10 mg total) by mouth 2 (two) times daily as needed for muscle spasms. 09/16/15    Benjiman CorePickering, Nathan, MD  hydrochlorothiazide (HYDRODIURIL) 12.5 MG tablet Take 2 tablets (25 mg total) by mouth daily. 12/20/16   Rolland PorterJames, Terriyah Westra, MD  ibuprofen (ADVIL,MOTRIN) 800 MG tablet Take 1 tablet (800 mg total) by mouth 3 (three) times daily. 02/02/16   Rancour, Jeannett SeniorStephen, MD  losartan (COZAAR) 100 MG tablet Take 100 mg by mouth daily.    [provider]  metoprolol succinate (TOPROL-XL) 25 MG 24 hr tablet Take 1 tablet (25 mg total) by mouth daily. 12/20/16   Rolland PorterJames, Layken Doenges, MD    Family History No family history on file.  Social History Social History  Substance Use Topics  . Smoking status: Never Smoker  . Smokeless tobacco: Never Used  . Alcohol use No     Allergies   Patient has no known allergies.   Review of Systems Review of Systems  Constitutional: Negative for appetite change, chills, diaphoresis, fatigue and fever.  HENT: Negative for mouth sores, sore throat and trouble swallowing.        Upper Lip swelling  Eyes: Negative for visual disturbance.  Respiratory: Negative for cough, chest tightness, shortness of breath and wheezing.   Cardiovascular: Negative for chest pain.  Gastrointestinal: Negative for abdominal distention, abdominal pain, diarrhea, nausea and vomiting.  Endocrine: Negative for polydipsia, polyphagia and polyuria.  Genitourinary: Negative for dysuria, frequency and hematuria.  Musculoskeletal: Negative for gait problem.  Skin: Negative for color change, pallor and  rash.  Neurological: Negative for dizziness, syncope, light-headedness and headaches.  Hematological: Does not bruise/bleed easily.  Psychiatric/Behavioral: Negative for behavioral problems and confusion.     Physical Exam Updated Vital Signs BP (!) 118/49   Pulse 72   Temp 98.9 F (37.2 C) (Oral)   Resp 19   Ht 5\' 3"  (1.6 m)   Wt 263 lb (119.3 kg)   LMP 07/25/2013   SpO2 97%   BMI 46.59 kg/m   Physical Exam  Constitutional: She is oriented to person, place, and  time. She appears well-developed and well-nourished. No distress.  HENT:  Head: Normocephalic.  Soft tissue swelling with edema to upper lip  Eyes: Conjunctivae are normal. Pupils are equal, round, and reactive to light. No scleral icterus.  Neck: Normal range of motion. Neck supple. No thyromegaly present.  Cardiovascular: Normal rate and regular rhythm.  Exam reveals no gallop and no friction rub.   No murmur heard. Pulmonary/Chest: Effort normal and breath sounds normal. No respiratory distress. She has no wheezes. She has no rales.  Abdominal: Soft. Bowel sounds are normal. She exhibits no distension. There is no tenderness. There is no rebound.  Musculoskeletal: Normal range of motion.  Neurological: She is alert and oriented to person, place, and time.  Skin: Skin is warm and dry. No rash noted.  Psychiatric: She has a normal mood and affect. Her behavior is normal.     ED Treatments / Results  DIAGNOSTIC STUDIES: Oxygen Saturation is 97% on RA, normal by my interpretation.    COORDINATION OF CARE: 9:21 PM Discussed treatment plan with pt at bedside and pt agreed to plan. Labs (all labs ordered are listed, but only abnormal results are displayed) Labs Reviewed - No data to display  EKG  EKG Interpretation None       Radiology No results found.  Procedures Procedures (including critical care time)  Medications Ordered in ED Medications  diphenhydrAMINE (BENADRYL) injection 25 mg (25 mg Intravenous Given 12/20/16 2131)  methylPREDNISolone sodium succinate (SOLU-MEDROL) 125 mg/2 mL injection 125 mg (125 mg Intravenous Given 12/20/16 2131)     Initial Impression / Assessment and Plan / ED Course  I have reviewed the triage vital signs and the nursing notes.  Pertinent labs & imaging results that were available during my care of the patient were reviewed by me and considered in my medical decision making (see chart for details).     22:39:  Patient reevaluated.  Sleeping comfortably. Has improvement, but incomplete resolution of her angioedema. Tongue, palate, posterior Franks normal. Otherwise normal exam. I think she is appropriate for discharge home. I warned her about never taking lisinopril or ACE inhibitor's in the future. Prescription for metoprolol, and hydrocodone thiazide for blood pressure. Return if any sensation of worsening at home  Final Clinical Impressions(s) / ED Diagnoses   Final diagnoses:  Angioedema, initial encounter    New Prescriptions New Prescriptions   HYDROCHLOROTHIAZIDE (HYDRODIURIL) 12.5 MG TABLET    Take 2 tablets (25 mg total) by mouth daily.   METOPROLOL SUCCINATE (TOPROL-XL) 25 MG 24 HR TABLET    Take 1 tablet (25 mg total) by mouth daily.  I personally performed the services described in this documentation, which was scribed in my presence. The recorded information has been reviewed and is accurate.     Rolland Porter, MD 12/20/16 2230

## 2016-12-20 NOTE — Discharge Instructions (Signed)
Your reaction today was caused by your lisinopril. You should never take lisinopril or class of medicine called an ACE inhibitor again. New prescription for metoprolol, and hydrochlorothiazide for your blood pressure.

## 2016-12-20 NOTE — ED Triage Notes (Signed)
Reports has ice cream two hours ago at [employer] then had upper lip swelling. Patient is also on lisinopril

## 2017-03-20 ENCOUNTER — Encounter: Payer: Self-pay | Admitting: Allergy & Immunology

## 2017-03-20 ENCOUNTER — Ambulatory Visit (INDEPENDENT_AMBULATORY_CARE_PROVIDER_SITE_OTHER): Payer: 59 | Admitting: Allergy & Immunology

## 2017-03-20 VITALS — BP 122/84 | HR 69 | Temp 98.5°F | Resp 16 | Ht 64.0 in | Wt 264.0 lb

## 2017-03-20 DIAGNOSIS — T464X5D Adverse effect of angiotensin-converting-enzyme inhibitors, subsequent encounter: Secondary | ICD-10-CM | POA: Diagnosis not present

## 2017-03-20 DIAGNOSIS — T781XXD Other adverse food reactions, not elsewhere classified, subsequent encounter: Secondary | ICD-10-CM

## 2017-03-20 DIAGNOSIS — T783XXD Angioneurotic edema, subsequent encounter: Secondary | ICD-10-CM | POA: Diagnosis not present

## 2017-03-20 DIAGNOSIS — J310 Chronic rhinitis: Secondary | ICD-10-CM | POA: Diagnosis not present

## 2017-03-20 NOTE — Progress Notes (Signed)
NEW PATIENT  Date of Service/Encounter:  03/20/17  Referring provider: Deneen Harts, FNP   Assessment:   Chronic rhinitis - with negative testing today, but inadequate controls   Adverse food reaction (peanuts, shrimp) - with negative testing today, but inadequate controls   ACE inhibitor-aggravated angioedema - stopped ACE inhibitor in May 2018  Plan/Recommendations:   1. Seasonal allergic rhinitis - We could not do testing because your histamine was not very reactive. - We will get blood work instead. - Start Flonase two sprays per nostril daily to help control nasal inflammation and help with ear drainagee. - Once we have the results from the blood work, can we look into avoidance measures and additional medications +/- allergen immunotherapy.  2. Adverse food reactions - We will get testing for peanuts, tree nuts, shellfish, and milk since you reacted to these. - Continue to take a journal of any reactions and foods. - We will hold off on an EpiPen for now. - Barbara Christensen is OK with this since she tells me that she would "never stab [herself]".  3. ACE inhibitor angioedema - Your reaction was consistent with this diagnosis. - I agree with the change in the antihypertensive. - Sometimes these reactions can continue for up to 1-2 years even after STOPPING the medication. - They should decrease in frequency, however.  4. Return in about 3 months (around 06/20/2017).   Subjective:   Barbara Christensen is a 56 y.o. female presenting today for evaluation of  Chief Complaint  Patient presents with  . Allergic Reaction    Barbara Christensen has a history of the following: Patient Active Problem List   Diagnosis Date Noted  . Chronic rhinitis 03/20/2017  . ACE inhibitor-aggravated angioedema, subsequent encounter 03/20/2017    History obtained from: chart review and patient.  Barbara Christensen was referred by Deneen Harts, FNP.     Barbara Christensen is a 56 y.o. female  presenting for an evaluation of swelling and concern for food allergies. Around Mother's Day, she developed lip swelling after eating ice cream. She was on lisinopril at the time. This was just swelling without a rash. It was not itching, but more painful. She had been on the lisinopril for 10+ years. She did get IV medications at the ED visit. Symptoms resolved after 2-3 days.   Since that time, she has developed throat soreness with exposure to shrimp and peanut butter. She then took a cetirizine which cleared up the symptoms within 20-30 minutes. She never had any additional symptoms including wheezing, stomach pain, or other systemic symptoms. She can eat regular fin fish. She does not eat a lot of eggs. She does eat wheat. She does fine with fruits. She has been eating more smoothies and tolerates a wide variety of fruits and vegetables.    She does have a history of itchy watery eyes and rhinorrhea. This started around 56 years of age. Symptoms occur mostly in the change of the weather. She will have a head cold in the mornings, but this seems to improve over the course of the day. She does take [facility] allergy medication which does provide some relief. She has no nose sprays.   She has no history of asthma and has never needed an inhaler. She has never been to the ED for breathing problems and has never needed prednisone. Otherwise, there is no history of other atopic diseases, including drug allergies, stinging insect allergies, or urticaria. There is no significant infectious history. Vaccinations are up to  date.    Past Medical History: Patient Active Problem List   Diagnosis Date Noted  . Chronic rhinitis 03/20/2017  . ACE inhibitor-aggravated angioedema, subsequent encounter 03/20/2017    Medication List:  Allergies as of 03/20/2017      Reactions   Ace Inhibitors    Angio   Lisinopril    angio      Medication List       Accurate as of 03/20/17 12:56 PM. Always use your most  recent med list.          gabapentin 100 MG capsule Commonly known as:  NEURONTIN Take 100 mg by mouth 3 (three) times daily.   metoprolol succinate 25 MG 24 hr tablet Commonly known as:  TOPROL-XL Take 1 tablet (25 mg total) by mouth daily.       Birth History: non-contributory.   Developmental History: non-contributory.   Past Surgical History: Past Surgical History:  Procedure Laterality Date  . CESAREAN SECTION    . TUBAL LIGATION       Family History: Family History  Problem Relation Age of Onset  . Allergic rhinitis Daughter   . Eczema Daughter   . Allergic rhinitis Grandchild   . Eczema Grandchild   . Food Allergy Grandchild   . Asthma Neg Hx   . Urticaria Neg Hx   . Immunodeficiency Neg Hx   . Angioedema Neg Hx   . Atopy Neg Hx      Social History: Barbara Christensen lives at home with her 28yo son, who lives with her intermittently. She lives in a ten year old home. There is carpeting throughout the home. She has gas heating and central cooling. There are no animals inside or outside of the home. There are no dust mite coverings and no tobacco exposure. She currently works as an Environmental health practitioner for [employer].     Review of Systems: a 14-point review of systems is pertinent for what is mentioned in HPI.  Otherwise, all other systems were negative. Constitutional: negative other than that listed in the HPI Eyes: negative other than that listed in the HPI Ears, nose, mouth, throat, and face: negative other than that listed in the HPI Respiratory: negative other than that listed in the HPI Cardiovascular: negative other than that listed in the HPI Gastrointestinal: negative other than that listed in the HPI Genitourinary: negative other than that listed in the HPI Integument: negative other than that listed in the HPI Hematologic: negative other than that listed in the HPI Musculoskeletal: negative other than that listed in the HPI Neurological:  negative other than that listed in the HPI Allergy/Immunologic: negative other than that listed in the HPI    Objective:   Blood pressure 122/84, pulse 69, temperature 98.5 F (36.9 C), temperature source Oral, resp. rate 16, height 5\' 4"  (1.626 m), weight 264 lb (119.7 kg), last menstrual period 07/25/2013, SpO2 94 %. Body mass index is 45.32 kg/m.   Physical Exam:  General: Alert, interactive, in no acute distress. Obese female. Very pleasant and boisterous.  Eyes: No conjunctival injection present on the right, No conjunctival injection present on the left, PERRL bilaterally, No discharge on the right, No discharge on the left and No Horner-Trantas dots present Ears: Right TM pearly gray with normal light reflex, Left TM pearly gray with normal light reflex, Right TM intact without perforation and Left TM intact without perforation.  Nose/Throat: External nose within normal limits and septum midline, turbinates edematous and pale with clear discharge,  post-pharynx erythematous with cobblestoning in the posterior oropharynx. Tonsils 2+ without exudates Neck: Supple without thyromegaly.  Adenopathy: Shoddy bilateral anterior cervical lymphadenopathy., No enlarged lymph nodes appreciated in the occipital, axillary, epitrochlear, inguinal, or popliteal regions. and No enlarged lymph nodes appreciated in the anterior cervical, occipital, axillary, epitrochlear, inguinal, or popliteal regions. Lungs: Clear to auscultation without wheezing, rhonchi or rales. No increased work of breathing. CV: Normal S1/S2, no murmurs. Capillary refill <2 seconds.  Abdomen: Nondistended, nontender. No guarding or rebound tenderness. Bowel sounds present in all fields and hypoactive  Skin: Warm and dry, without lesions or rashes. Extremities:  No clubbing, cyanosis or edema. Neuro:   Grossly intact. No focal deficits appreciated. Responsive to questions.  Diagnostic studies:    Allergy Studies:    Indoor/Outdoor Percutaneous Adult Environmental Panel: negative to the entire panel, however the controls were negative as well, therefore interpretation was not possible.   Most Common Foods Panel (peanut, cashew, soy, fish mix, shellfish mix, wheat, milk, egg): negative to the entire panel, however the controls were negative as well, therefore interpretation was not possible.      Malachi BondsJoel Gallagher, MD FAAAAI [facility]

## 2017-03-20 NOTE — Patient Instructions (Addendum)
1. Seasonal allergic rhinitis - We could not do testing because your histamine was not very reactive. - We will get blood work instead. - Start Flonase two sprays per nostril daily to help control nasal inflammation and help with ear draingae.  2. Adverse food reactions - We will get testing for peanuts, tree nuts, shellfish, and milk since you reacted to these. - Continue to take a journal of any reactions and foods. - We will hold off on an EpiPen for now.  3. ACE inhibitor angioedema - Your reaction was consistent with this diagnosis. - I agree with the change in the antihypertensive. - Sometimes these reactions can continue for up to 1-2 years even after STOPPING the medication. - They should decrease in frequency, however.  4. Return in about 3 months (around 06/20/2017).    Please inform us of any Emergency Department visits, hospitalizations, or changes in symptoms. Call us before going to the ED for breathing or allergy symptoms since we might be able to fit you in for a sick visit. Feel free to contact us anytime with any questions, problems, or concerns.  It was a pleasure to meet you today! Enjoy the rest of your summer!   Websites that have reliable patient information: 1. American Academy of Asthma, Allergy, and Immunology: www.aaaai.org 2. Food Allergy Research and Education (FARE): foodallergy.org 3. Mothers of Asthmatics: http://www.asthmacommunitynetwork.org 4. American College of Allergy, Asthma, and Immunology: www.acaai.org

## 2017-03-23 ENCOUNTER — Other Ambulatory Visit: Payer: Self-pay

## 2017-03-23 LAB — MILK COMPONENT PANEL
F076-IgE Alpha Lactalbumin: <0.1 kU/L
F077-IgE Beta Lactoglobulin: <0.1 kU/L
F078-IgE Casein: <0.1 kU/L

## 2017-03-23 LAB — ALLERGEN PROFILE, SHELLFISH
F023-IgE Crab: <0.1 kU/L
F080-IgE Lobster: <0.1 kU/L
F290-IgE Oyster: <0.1 kU/L
Scallop IgE: <0.1 kU/L

## 2017-03-23 LAB — ALLERGEN, PEANUT F13

## 2017-03-23 LAB — ALLERGEN, PEANUT COMPONENT PANEL: F423-IgE Ara h 2: <0.1 kU/L

## 2017-03-23 LAB — TRYPTASE: Tryptase: 3.4 ug/L (ref 2.2–13.2)

## 2017-03-23 LAB — ALLERGEN MILK

## 2017-03-23 MED ORDER — FLUTICASONE PROPIONATE 50 MCG/ACT NA SUSP: 2.0000 | Freq: Every day | NASAL | 5 refills | Status: DC

## 2017-03-23 NOTE — Telephone Encounter (Signed)
[facility] [address] [facility]f

## 2017-09-15 IMAGING — CT CT RENAL STONE PROTOCOL
2 of 4 series · 16 of 46 positions shown, 18 images · non-contrast
Comparison: None.

CLINICAL DATA: Left flank pain for 1 week. Recently diagnosed with
UTI.

EXAM:
CT ABDOMEN AND PELVIS WITHOUT CONTRAST
TECHNIQUE: Multidetector CT imaging of the abdomen and pelvis was performed
following the standard protocol without IV contrast.

[Series 2: axial st · axial · 0.98mm/px · z∈[-451,-71]mm · 13 of 84 slices shown, 15 images]
[im 4/84  soft-tissue]
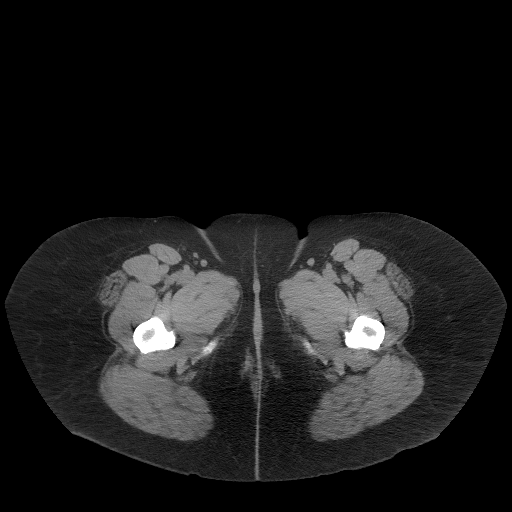
[im 4/84  bone]
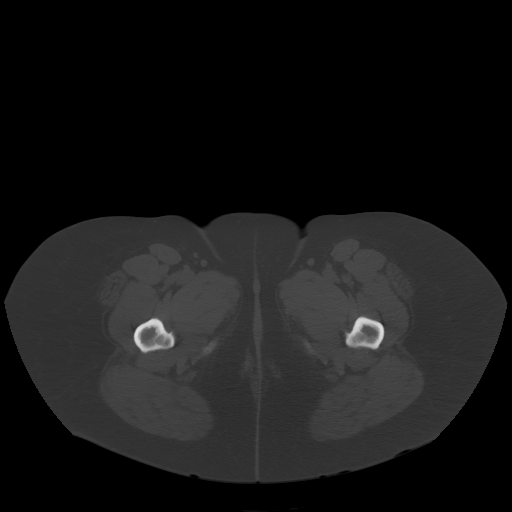
[im 10/84  soft-tissue]
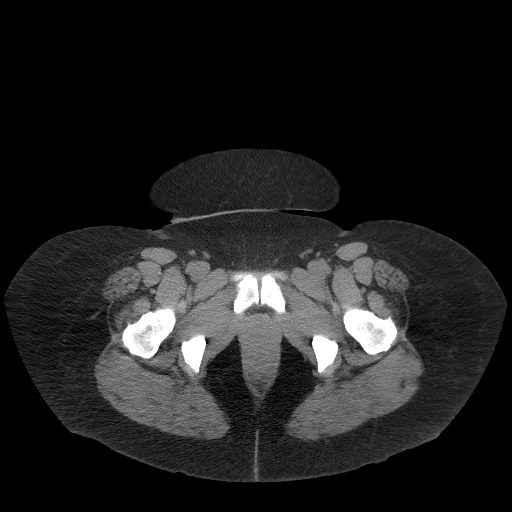
[im 17/84  soft-tissue]
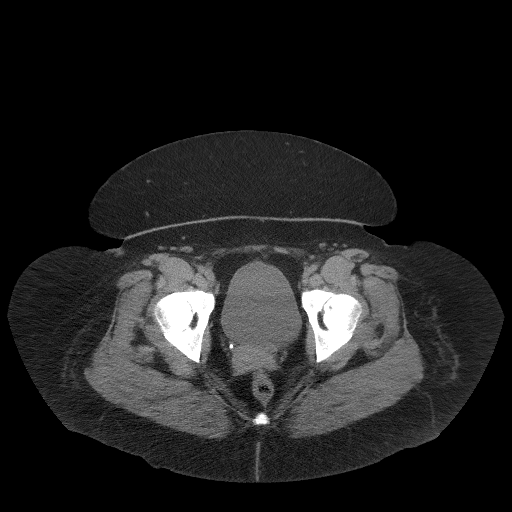
[im 24/84  soft-tissue]
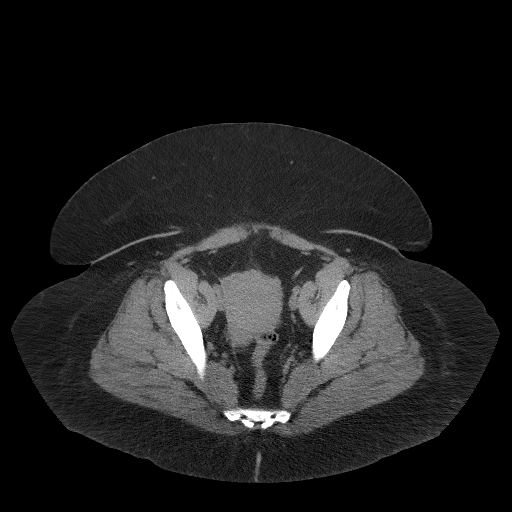
[im 30/84  soft-tissue]
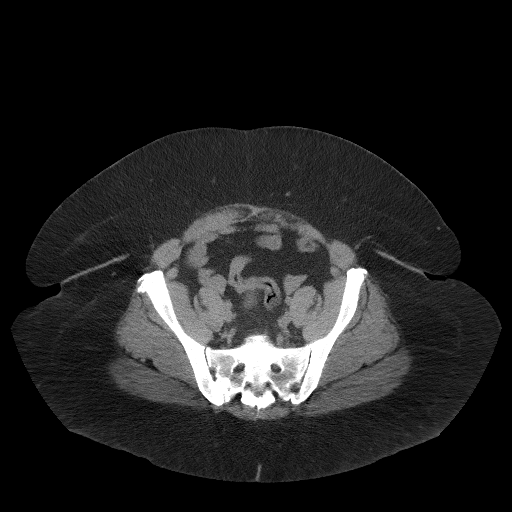
[im 37/84  soft-tissue]
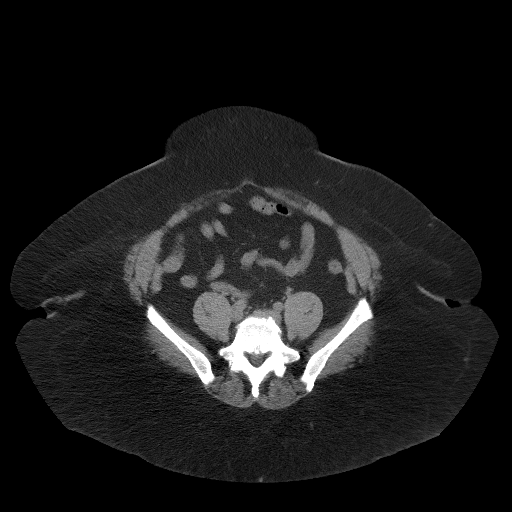
[im 44/84  soft-tissue]
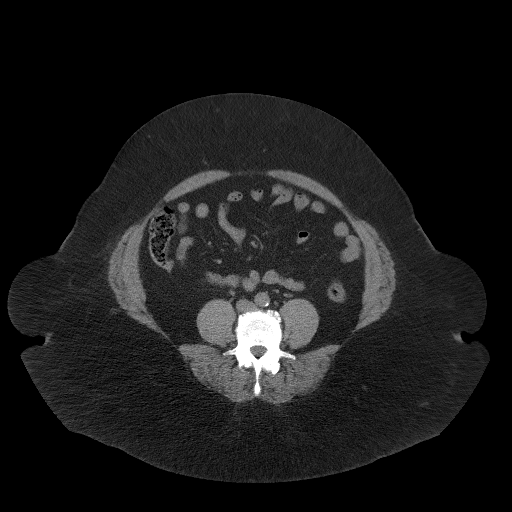
[im 47/84  soft-tissue]
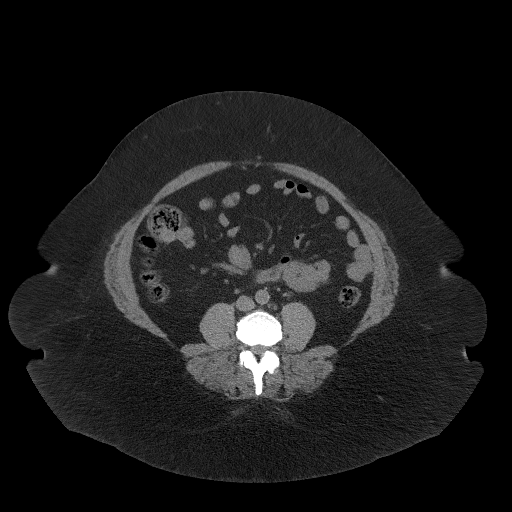
[im 54/84  soft-tissue]
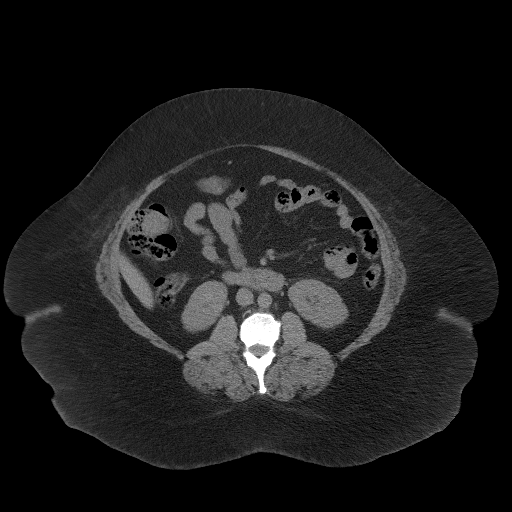
[im 54/84  bone]
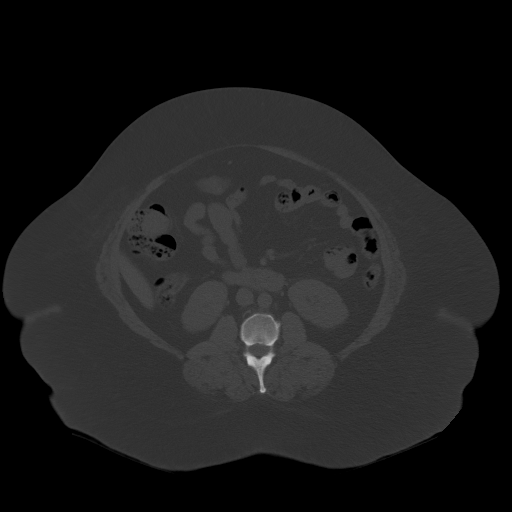
[im 60/84  soft-tissue]
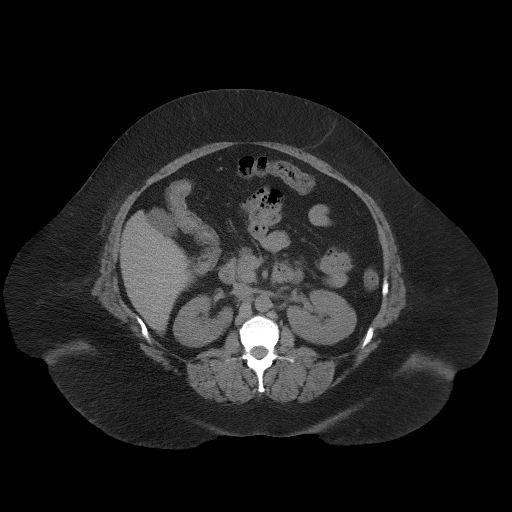
[im 67/84  soft-tissue]
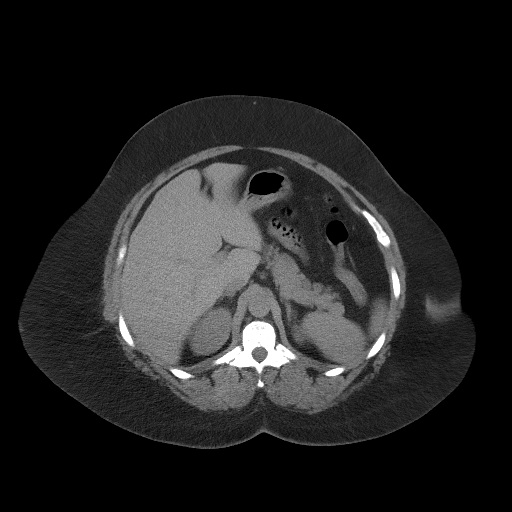
[im 74/84  soft-tissue]
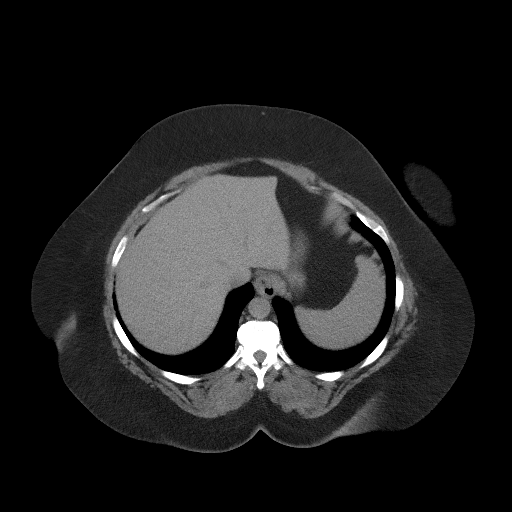
[im 80/84  soft-tissue]
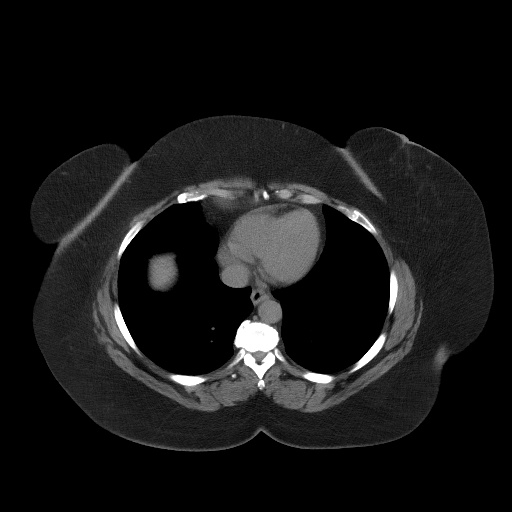

[Series 5: coronal st · coronal · 0.80mm/px · 3 of 103 slices shown]
[im 35/103  soft-tissue]
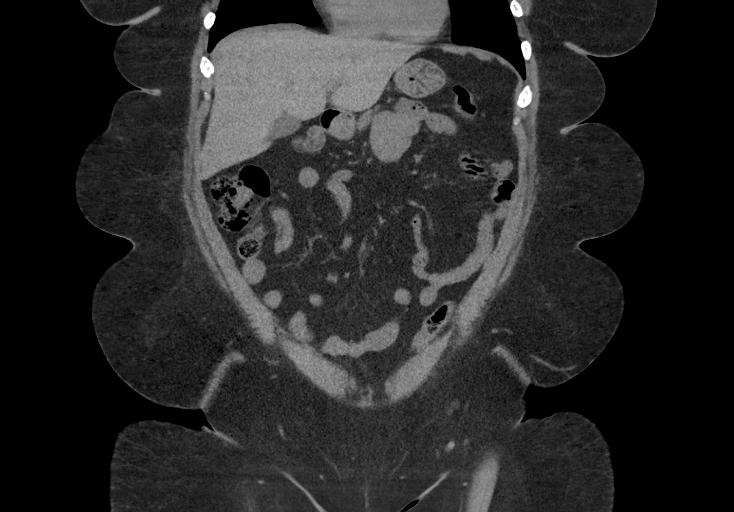
[im 46/103  soft-tissue]
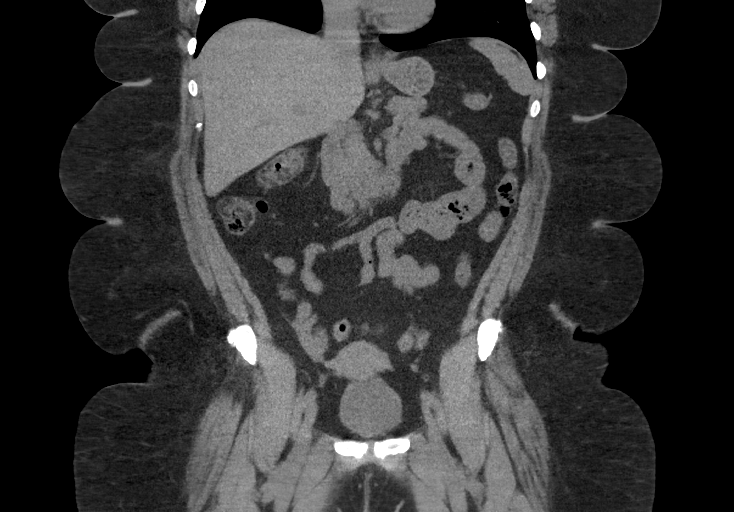
[im 57/103  soft-tissue]
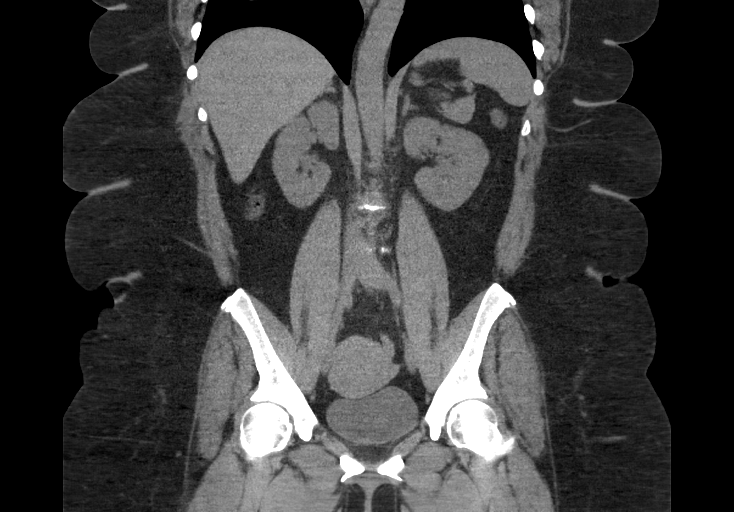

[16 of 46 positions shown; findings below may reference images not displayed]

FINDINGS: Normal lung bases.

No free air or fluid.

No renal stones, masses, perinephric stranding, or hydronephrosis.
No ureterectasis or ureteral stones identified. Phleboliths are seen
in the pelvis.

The liver, gallbladder, spleen, pancreas, and adrenal glands are
normal. There is a small hiatal hernia. The stomach, small bowel,
colon, and appendix are normal. Mild atherosclerotic change in the
non aneurysmal aorta. No adenopathy identified.

The uterus and adnexae are normal. No adenopathy or mass. The
bladder is unremarkable.

Degenerative changes seen in the spine. The bones are otherwise
unremarkable.
IMPRESSION: 1. No renal stones or obstruction.
2. No cause for left lower quadrant pain identified.

## 2018-02-01 ENCOUNTER — Emergency Department (HOSPITAL_BASED_OUTPATIENT_CLINIC_OR_DEPARTMENT_OTHER)
Admission: EM | Admit: 2018-02-01 | Discharge: 2018-02-01 | Disposition: A | Discharge status: Home / Self Care | Payer: 59 | Attending: Emergency Medicine | Admitting: Emergency Medicine

## 2018-02-01 ENCOUNTER — Other Ambulatory Visit: Payer: Self-pay

## 2018-02-01 ENCOUNTER — Encounter (HOSPITAL_BASED_OUTPATIENT_CLINIC_OR_DEPARTMENT_OTHER): Payer: Self-pay

## 2018-02-01 DIAGNOSIS — R0981 Nasal congestion: Secondary | ICD-10-CM | POA: Insufficient documentation

## 2018-02-01 DIAGNOSIS — H5789 Other specified disorders of eye and adnexa: Secondary | ICD-10-CM | POA: Insufficient documentation

## 2018-02-01 DIAGNOSIS — Z79899 Other long term (current) drug therapy: Secondary | ICD-10-CM | POA: Insufficient documentation

## 2018-02-01 DIAGNOSIS — I10 Essential (primary) hypertension: Secondary | ICD-10-CM | POA: Insufficient documentation

## 2018-02-01 MED ORDER — FLUORESCEIN SODIUM 1 MG OP STRP
2.0000 | ORAL_STRIP | Freq: Once | OPHTHALMIC | Status: AC
  Administered 2018-02-01: 2 via OPHTHALMIC
  Filled 2018-02-01: qty 2

## 2018-02-01 MED ORDER — TETRACAINE HCL 0.5 % OP SOLN
2.0000 [drp] | Freq: Once | OPHTHALMIC | Status: AC
  Administered 2018-02-01: 2 [drp] via OPHTHALMIC
  Filled 2018-02-01: qty 4

## 2018-02-01 NOTE — Discharge Instructions (Signed)
Start taking Allegra once a day, in addition to using Nasacort nasal spray once a day.    Naphcon A eye allergy drops or a similar drop from visine may help with the itching.  Please do not wear your contact lenses until this has fully resolved.  Please throw away the Contact lenses that you were using, and switch back to the contact lens solution that you are previously using.  Please follow-up with your eye doctor and primary care doctor.

## 2018-02-01 NOTE — ED Provider Notes (Signed)
[facility] EMERGENCY DEPARTMENT Provider Note   CSN: [id number] Arrival date & time: 02/01/18  1550     History   Chief Complaint Chief Complaint  Patient presents with  . Eye Problem    HPI Barbara Christensen is a 57 y.o. female with a past medical history of hypertension, back pain, who presents today for evaluation of nasal congestion and bilateral eye burning.  Her nasal congestion and postnasal drainage has been present for approximately 1 month is intermittent, and worse at night when she lays down.  Her eyes started burning today.  She is a contact lens wearer.  She reports that up around 1 week ago she did change the contact lens solution that she was using.  She tried taking 1 dose of Claritin today without significant improvement or relief.  She has not been to her primary care doctor or eye doctor about this.    HPI  Past Medical History:  Diagnosis Date  . Arthritis   . Back pain   . Hypertension     Patient Active Problem List   Diagnosis Date Noted  . Chronic rhinitis 03/20/2017  . ACE inhibitor-aggravated angioedema, subsequent encounter 03/20/2017    Past Surgical History:  Procedure Laterality Date  . CESAREAN SECTION    . TUBAL LIGATION       OB History   None      Home Medications    Prior to Admission medications   Medication Sig Start Date End Date Taking? Authorizing Provider  fluticasone (FLONASE) 50 MCG/ACT nasal spray Place 2 sprays into both nostrils daily. 03/23/17   Alfonse Spruce, MD  gabapentin (NEURONTIN) 100 MG capsule Take 100 mg by mouth 3 (three) times daily.    [provider]  metoprolol succinate (TOPROL-XL) 25 MG 24 hr tablet Take 1 tablet (25 mg total) by mouth daily. 12/20/16   Rolland Porter, MD    Family History Family History  Problem Relation Age of Onset  . Allergic rhinitis Daughter   . Eczema Daughter   . Allergic rhinitis Grandchild   . Eczema Grandchild   . Food Allergy Grandchild   .  Asthma Neg Hx   . Urticaria Neg Hx   . Immunodeficiency Neg Hx   . Angioedema Neg Hx   . Atopy Neg Hx     Social History Social History   Tobacco Use  . Smoking status: Never Smoker  . Smokeless tobacco: Never Used  Substance Use Topics  . Alcohol use: No  . Drug use: No     Allergies   Ace inhibitors and Lisinopril   Review of Systems Review of Systems  Constitutional: Negative for chills and fever.  HENT: Positive for congestion, postnasal drip, rhinorrhea, sinus pressure and sinus pain.   Eyes: Positive for photophobia, redness and itching. Negative for visual disturbance (Vision with out correction is unchanged from normal .).  Musculoskeletal: Negative for neck pain.  All other systems reviewed and are negative.    Physical Exam Updated Vital Signs BP 136/88 (BP Location: Left Arm)   Pulse 68   Temp 98.2 F (36.8 C) (Oral)   Resp 20   Ht 5\' 2"  (1.575 m)   Wt 123 kg (271 lb 2.7 oz)   LMP 07/25/2013   SpO2 98%   BMI 49.60 kg/m   Physical Exam  Constitutional: She appears well-developed and well-nourished. No distress.  HENT:  Head: Normocephalic and atraumatic.  Right Ear: Tympanic membrane, external ear and ear canal normal.  Left Ear: Tympanic membrane, external ear and ear canal normal.  Nose: Mucosal edema and rhinorrhea present.  Mouth/Throat: Uvula is midline, oropharynx is clear and moist and mucous membranes are normal. No oropharyngeal exudate. No tonsillar exudate.  Bilateral serous otitis  Eyes: Pupils are equal, round, and reactive to light. Conjunctivae, EOM and lids are normal. Right conjunctiva is not injected. Right conjunctiva has no hemorrhage. Left conjunctiva is not injected. Left conjunctiva has no hemorrhage. No scleral icterus.  Slit lamp exam:      The right eye shows no fluorescein uptake.       The left eye shows no fluorescein uptake.  No obvious abnormal erythema to eyes.  Mild bilateral photophobia, no consensual photophobia.   Neck: Normal range of motion and full passive range of motion without pain. Neck supple.  Lymphadenopathy:    She has no cervical adenopathy.  Neurological: She is alert.  Skin: Skin is warm and dry. She is not diaphoretic.  Psychiatric: She has a normal mood and affect. Her behavior is normal.  Nursing note and vitals reviewed.    ED Treatments / Results  Labs (all labs ordered are listed, but only abnormal results are displayed) Labs Reviewed - No data to display  EKG None  Radiology No results found.  Procedures Procedures (including critical care time)  Medications Ordered in ED Medications  tetracaine (PONTOCAINE) 0.5 % ophthalmic solution 2 drop (2 drops Both Eyes Given by Other 02/01/18 1639)  fluorescein ophthalmic strip 2 strip (2 strips Both Eyes Given 02/01/18 1639)     Initial Impression / Assessment and Plan / ED Course  I have reviewed the triage vital signs and the nursing notes.  Pertinent labs & imaging results that were available during my care of the patient were reviewed by me and considered in my medical decision making (see chart for details).    Patient presents today for evaluation of bilateral eye burning and irritation since today in the setting of nasal congestion and postnasal drip for approximately 1 month.  Symptoms generally consistent with allergic rhinitis.  She recently changed her contact lens solution.  No fluorescein uptake bilaterally.  His vision is unchanged from her normal no uncorrected for patient, and she is not having thick, green eye drainage not concerned for iritis, bacterial conjunctivitis.  Suspect allergic eye irritation.  Given instructions on conservative care and over-the-counter treatment.  Return precautions discussed, patient states understanding.  Follow-up with PCP and ophthalmologist if not showing signs of improvement in 3 days.  Discharged home.  Final Clinical Impressions(s) / ED Diagnoses   Final diagnoses:  Eye  irritation  Nasal congestion    ED Discharge Orders    None       Norman ClayHammond, Ninette Cotta W, PA-C 02/01/18 1701    Arby BarrettePfeiffer, Marcy, MD 02/11/18 1858

## 2018-02-01 NOTE — ED Triage Notes (Signed)
C/o bilat eye burning, redness x today-NAD-steady gait

## 2018-02-01 NOTE — ED Notes (Signed)
ED Provider at bedside. 

## 2018-02-07 ENCOUNTER — Emergency Department (HOSPITAL_BASED_OUTPATIENT_CLINIC_OR_DEPARTMENT_OTHER)
Admission: EM | Admit: 2018-02-07 | Discharge: 2018-02-07 | Disposition: A | Discharge status: Home / Self Care | Payer: Self-pay | Attending: Emergency Medicine | Admitting: Emergency Medicine

## 2018-02-07 ENCOUNTER — Encounter (HOSPITAL_BASED_OUTPATIENT_CLINIC_OR_DEPARTMENT_OTHER): Payer: Self-pay | Admitting: Adult Health

## 2018-02-07 ENCOUNTER — Other Ambulatory Visit: Payer: Self-pay

## 2018-02-07 DIAGNOSIS — Y939 Activity, unspecified: Secondary | ICD-10-CM | POA: Insufficient documentation

## 2018-02-07 DIAGNOSIS — I10 Essential (primary) hypertension: Secondary | ICD-10-CM | POA: Insufficient documentation

## 2018-02-07 DIAGNOSIS — Z79899 Other long term (current) drug therapy: Secondary | ICD-10-CM | POA: Insufficient documentation

## 2018-02-07 DIAGNOSIS — X58XXXA Exposure to other specified factors, initial encounter: Secondary | ICD-10-CM | POA: Insufficient documentation

## 2018-02-07 DIAGNOSIS — Y929 Unspecified place or not applicable: Secondary | ICD-10-CM | POA: Insufficient documentation

## 2018-02-07 DIAGNOSIS — S0501XA Injury of conjunctiva and corneal abrasion without foreign body, right eye, initial encounter: Secondary | ICD-10-CM | POA: Insufficient documentation

## 2018-02-07 DIAGNOSIS — Y999 Unspecified external cause status: Secondary | ICD-10-CM | POA: Insufficient documentation

## 2018-02-07 MED ORDER — FLUORESCEIN SODIUM 1 MG OP STRP
1.0000 | ORAL_STRIP | Freq: Once | OPHTHALMIC | Status: AC
  Administered 2018-02-07: 1 via OPHTHALMIC
  Filled 2018-02-07: qty 1

## 2018-02-07 MED ORDER — TETRACAINE HCL 0.5 % OP SOLN
1.0000 [drp] | Freq: Once | OPHTHALMIC | Status: AC
  Administered 2018-02-07: 1 [drp] via OPHTHALMIC
  Filled 2018-02-07: qty 4

## 2018-02-07 MED ORDER — OFLOXACIN 0.3 % OP SOLN
1.0000 [drp] | Freq: Four times a day (QID) | OPHTHALMIC | Status: DC
  Administered 2018-02-07 (×2): 1 [drp] via OPHTHALMIC
  Filled 2018-02-07: qty 5

## 2018-02-07 MED ORDER — ARTIFICIAL TEARS OPHTHALMIC OINT
TOPICAL_OINTMENT | Freq: Once | OPHTHALMIC | Status: AC
  Administered 2018-02-07: 1 via OPHTHALMIC
  Filled 2018-02-07: qty 3.5

## 2018-02-07 NOTE — Discharge Instructions (Addendum)
We saw in the ER for eye pain. We noted that he have corneal abrasion in your right eye, which is the eye that is painful with light. Please see the ophthalmologist as requested and 2 to 3 days.  Please put the antibiotic drops into your eye 4 times a day for the next 7 days, unless the ophthalmologist gives you a different medication. You can apply the artificial tears 4 or 5 times a day as well for the next 7 days.

## 2018-02-07 NOTE — ED Notes (Signed)
Pt states she was seenh here Tuesday and they told her it was allergies and she was told to get some OTC allergy drops and nasal spray, but that has made the s/s worse. Presents with right eye red with some periorbital swelling and clear drainage. Eye crusted shut this morning. Left eye reddened as well. Pt wears contacts and says she can only see the "Large E" on the eye chart.

## 2018-02-07 NOTE — ED Notes (Signed)
Pt unable to do visual acuity due to not having her contacts. Pt states all she can see is the big E.

## 2018-02-07 NOTE — ED Triage Notes (Addendum)
PResents with redness, pain and tearing to bilateral eyes. Right is worse Than left. She endorses severe eye pain. She was seen here Tuesday and has been using the ye drops without relief.  Pupils are equal and reactive

## 2018-02-07 NOTE — ED Provider Notes (Signed)
[facility] EMERGENCY DEPARTMENT Provider Note   CSN: [id number] Arrival date & time: 02/07/18  0023     History   Chief Complaint Chief Complaint  Patient presents with  . Eye Pain    HPI Barbara Christensen is a 57 y.o. female.  HPI  57 year old female comes in with chief complaint of right-sided eye pain. Patient states that she started having bilateral eye itching and tearing few days ago.  Patient came to the emergency room and was informed that she most likely has allergic conjunctivitis.  Patient states that starting yesterday she has had right-sided eye pain, with yellow, sticky discharge.  Patient also has light sensitivity.  Patient has not been wearing contacts.  She is unsure if her vision is changed, because she does not have any glasses to wear at this time.  Past Medical History:  Diagnosis Date  . Arthritis   . Back pain   . Hypertension     Patient Active Problem List   Diagnosis Date Noted  . Chronic rhinitis 03/20/2017  . ACE inhibitor-aggravated angioedema, subsequent encounter 03/20/2017    Past Surgical History:  Procedure Laterality Date  . CESAREAN SECTION    . TUBAL LIGATION       OB History   None      Home Medications    Prior to Admission medications   Medication Sig Start Date End Date Taking? Authorizing Provider  fluticasone (FLONASE) 50 MCG/ACT nasal spray Place 2 sprays into both nostrils daily. 03/23/17   Alfonse SpruceGallagher, Joel Louis, MD  gabapentin (NEURONTIN) 100 MG capsule Take 100 mg by mouth 3 (three) times daily.    [provider]  metoprolol succinate (TOPROL-XL) 25 MG 24 hr tablet Take 1 tablet (25 mg total) by mouth daily. 12/20/16   Rolland PorterJames, Mark, MD    Family History Family History  Problem Relation Age of Onset  . Allergic rhinitis Daughter   . Eczema Daughter   . Allergic rhinitis Grandchild   . Eczema Grandchild   . Food Allergy Grandchild   . Asthma Neg Hx   . Urticaria Neg Hx   .  Immunodeficiency Neg Hx   . Angioedema Neg Hx   . Atopy Neg Hx     Social History Social History   Tobacco Use  . Smoking status: Never Smoker  . Smokeless tobacco: Never Used  Substance Use Topics  . Alcohol use: No  . Drug use: No     Allergies   Ace inhibitors and Lisinopril   Review of Systems Review of Systems  Constitutional: Positive for activity change. Negative for fever.  Eyes: Positive for photophobia, pain, discharge, redness and itching.  Skin: Negative for rash.  Allergic/Immunologic: Negative for immunocompromised state.     Physical Exam Updated Vital Signs BP 134/64 (BP Location: Right Arm)   Pulse 62   Temp 97.7 F (36.5 C) (Oral)   Resp 20   Ht 5\' 2"  (1.575 m)   Wt 122.9 kg (271 lb)   LMP 07/25/2013   SpO2 99%   BMI 49.57 kg/m   Physical Exam  Constitutional: She is oriented to person, place, and time. She appears well-developed.  HENT:  Head: Normocephalic and atraumatic.  Eyes: EOM are normal.  Bilateral eye exam: EOMI, PERRL + photophobia on the R eye.  Eye lid eversion reveals no foreign body Pt has no chemosis Fluorecin test under woods lamp reveals linear area of dye uptake in the middle of the cornea.   Neck: Normal  range of motion. Neck supple.  Cardiovascular: Normal rate.  Pulmonary/Chest: Effort normal.  Abdominal: Bowel sounds are normal.  Neurological: She is alert and oriented to person, place, and time.  Skin: Skin is warm and dry.  Nursing note and vitals reviewed.   ED Treatments / Results  Labs (all labs ordered are listed, but only abnormal results are displayed) Labs Reviewed - No data to display  EKG None  Radiology No results found.  Procedures Procedures (including critical care time)  Medications Ordered in ED Medications  tetracaine (PONTOCAINE) 0.5 % ophthalmic solution 1 drop (1 drop Both Eyes Given 02/07/18 0456)  fluorescein ophthalmic strip 1 strip (1 strip Both Eyes Given 02/07/18 0501)    artificial tears (LACRILUBE) ophthalmic ointment (1 application Both Eyes Given 02/07/18 0526)     Initial Impression / Assessment and Plan / ED Course  I have reviewed the triage vital signs and the nursing notes.  Pertinent labs & imaging results that were available during my care of the patient were reviewed by me and considered in my medical decision making (see chart for details).    Patient comes in with chief complaint of eye pain. On exam patient is noted to have right-sided corneal abrasion.  She does not have uveitis.  We will give patient ofloxacin drops, and advised her to follow-up with ophthalmology.  Strict ER return precautions have been discussed, and patient is agreeing with the plan and is comfortable with the workup done and the recommendations from the ER.   Final Clinical Impressions(s) / ED Diagnoses   Final diagnoses:  Abrasion of right cornea, initial encounter    ED Discharge Orders    None       Derwood Kaplan, MD 02/07/18 [phone]

## 2018-02-26 ENCOUNTER — Other Ambulatory Visit: Payer: Self-pay

## 2018-02-26 ENCOUNTER — Emergency Department (HOSPITAL_BASED_OUTPATIENT_CLINIC_OR_DEPARTMENT_OTHER)
Admission: EM | Admit: 2018-02-26 | Discharge: 2018-02-26 | Disposition: A | Discharge status: Home / Self Care | Payer: Self-pay | Attending: Emergency Medicine | Admitting: Emergency Medicine

## 2018-02-26 ENCOUNTER — Encounter (HOSPITAL_BASED_OUTPATIENT_CLINIC_OR_DEPARTMENT_OTHER): Payer: Self-pay | Admitting: Emergency Medicine

## 2018-02-26 DIAGNOSIS — S0502XA Injury of conjunctiva and corneal abrasion without foreign body, left eye, initial encounter: Secondary | ICD-10-CM | POA: Insufficient documentation

## 2018-02-26 DIAGNOSIS — X58XXXA Exposure to other specified factors, initial encounter: Secondary | ICD-10-CM | POA: Insufficient documentation

## 2018-02-26 DIAGNOSIS — I10 Essential (primary) hypertension: Secondary | ICD-10-CM | POA: Insufficient documentation

## 2018-02-26 DIAGNOSIS — Y999 Unspecified external cause status: Secondary | ICD-10-CM | POA: Insufficient documentation

## 2018-02-26 DIAGNOSIS — Y929 Unspecified place or not applicable: Secondary | ICD-10-CM | POA: Insufficient documentation

## 2018-02-26 DIAGNOSIS — Y939 Activity, unspecified: Secondary | ICD-10-CM | POA: Insufficient documentation

## 2018-02-26 MED ORDER — FLUORESCEIN SODIUM 1 MG OP STRP
ORAL_STRIP | OPHTHALMIC | Status: AC
  Filled 2018-02-26: qty 1

## 2018-02-26 MED ORDER — OFLOXACIN 0.3 % OP SOLN
OPHTHALMIC | Status: AC
  Filled 2018-02-26: qty 5

## 2018-02-26 MED ORDER — OFLOXACIN 0.3 % OP SOLN
1.0000 [drp] | Freq: Four times a day (QID) | OPHTHALMIC | Status: DC
  Administered 2018-02-26: 1 [drp] via OPHTHALMIC

## 2018-02-26 MED ORDER — TETRACAINE HCL 0.5 % OP SOLN
OPHTHALMIC | Status: AC
  Filled 2018-02-26: qty 4

## 2018-02-26 MED ORDER — OFLOXACIN 0.3 % OP SOLN: 1.0000 [drp] | Freq: Four times a day (QID) | OPHTHALMIC | 0 refills | Status: DC

## 2018-02-26 MED ORDER — TETRACAINE HCL 0.5 % OP SOLN
1.0000 [drp] | Freq: Once | OPHTHALMIC | Status: AC
  Administered 2018-02-26: 1 [drp] via OPHTHALMIC

## 2018-02-26 MED ORDER — HYDROCODONE-ACETAMINOPHEN 5-325 MG PO TABS: 1.0000 | ORAL_TABLET | Freq: Four times a day (QID) | ORAL | 0 refills | Status: DC | PRN

## 2018-02-26 MED ORDER — FLUORESCEIN SODIUM 1 MG OP STRP
1.0000 | ORAL_STRIP | Freq: Once | OPHTHALMIC | Status: AC
  Administered 2018-02-26: 1 via OPHTHALMIC

## 2018-02-26 NOTE — ED Provider Notes (Signed)
[facility] Provider Note: [name] [name], MD, FACEP  CSN: [id number] MRN: [id number] ARRIVAL: 02/26/18 at 0620 ROOM: [facility]02/[facility]02   CHIEF COMPLAINT  Eye Problem   HISTORY OF PRESENT ILLNESS  02/26/18 6:34 AM Barbara Christensen is a 57 y.o. female was treated on June 30 for right corneal abrasion.  She does wear contact lenses.  She is here now with pain in the left eye.  It is been present for 2 days and is moderate to severe.  It feels similar to her previous corneal abrasion.  She does not recall injuring it but admits to scratching her eyes due to allergies.  There is redness of the left eye with increased watering.  Pain is somewhat worse with exposure to light.   Past Medical History:  Diagnosis Date  . Arthritis   . Back pain   . Hypertension     Past Surgical History:  Procedure Laterality Date  . CESAREAN SECTION    . TUBAL LIGATION      Family History  Problem Relation Age of Onset  . Allergic rhinitis Daughter   . Eczema Daughter   . Allergic rhinitis Grandchild   . Eczema Grandchild   . Food Allergy Grandchild   . Asthma Neg Hx   . Urticaria Neg Hx   . Immunodeficiency Neg Hx   . Angioedema Neg Hx   . Atopy Neg Hx     Social History   Tobacco Use  . Smoking status: Never Smoker  . Smokeless tobacco: Never Used  Substance Use Topics  . Alcohol use: No  . Drug use: No    Prior to Admission medications   Medication Sig Start Date End Date Taking? Authorizing Provider  fluticasone (FLONASE) 50 MCG/ACT nasal spray Place 2 sprays into both nostrils daily. 03/23/17   Alfonse SpruceGallagher, Joel Louis, MD  gabapentin (NEURONTIN) 100 MG capsule Take 100 mg by mouth 3 (three) times daily.    [provider]  metoprolol succinate (TOPROL-XL) 25 MG 24 hr tablet Take 1 tablet (25 mg total) by mouth daily. 12/20/16   Rolland PorterJames, Mark, MD    Allergies Ace inhibitors and Lisinopril   REVIEW OF SYSTEMS  Negative except as noted here or in the History of  Present Illness.   PHYSICAL EXAMINATION  Initial Vital Signs Blood pressure (!) 167/84, pulse 76, temperature 98.1 F (36.7 C), temperature source Oral, resp. rate 20, height 5\' 3"  (1.6 m), weight 123.4 kg (272 lb), last menstrual period 07/25/2013, SpO2 97 %.  Examination General: Well-developed, well-nourished female in no acute distress; appearance consistent with age of record HENT: normocephalic; atraumatic Eyes: pupils equal, round and reactive to light; extraocular muscles intact; conjunctival injection and cysts serous drainage of the left eye; photophobia of the left eye; corneal abrasion of left cornea seen on fluorescein installation Neck: supple Heart: regular rate and rhythm Lungs: clear to auscultation bilaterally Abdomen: soft; nondistended; nontender; bowel sounds present Extremities: No deformity; full range of motion Neurologic: Awake, alert and oriented; motor function intact in all extremities and symmetric; no facial droop Skin: Warm and dry Psychiatric: Normal mood and affect   RESULTS  Summary of this visit's results, reviewed by myself:   EKG Interpretation  Date/Time:    Ventricular Rate:    PR Interval:    QRS Duration:   QT Interval:    QTC Calculation:   R Axis:     Text Interpretation:        Laboratory Studies: No results found for  this or any previous visit (from the past 24 hour(s)). Imaging Studies: No results found.  ED COURSE and MDM  Nursing notes and initial vitals signs, including pulse oximetry, reviewed.  Vitals:   02/26/18 0625  BP: (!) 167/84  Pulse: 76  Resp: 20  Temp: 98.1 F (36.7 C)  TempSrc: Oral  SpO2: 97%  Weight: 123.4 kg (272 lb)  Height: 5\' 3"  (1.6 m)   Discomfort temporarily relieved with tetracaine drop.  Examination is consistent with a corneal abrasion not uveitis.  We will treat with ofloxacin drops and refer to her eye doctor if symptoms persist.  PROCEDURES    ED DIAGNOSES     ICD-10-CM   1.  Abrasion of left cornea, initial encounter S05.02XA        Tedrick Port, Jonny Ruiz, MD 02/26/18 [phone]

## 2018-02-26 NOTE — ED Triage Notes (Signed)
Pt c/o irritation, pain, redness and drainage from eyes bilaterally, right eye is worse.

## 2018-04-18 ENCOUNTER — Encounter (HOSPITAL_BASED_OUTPATIENT_CLINIC_OR_DEPARTMENT_OTHER): Payer: Self-pay | Admitting: Emergency Medicine

## 2018-04-18 ENCOUNTER — Other Ambulatory Visit: Payer: Self-pay

## 2018-04-18 ENCOUNTER — Emergency Department (HOSPITAL_BASED_OUTPATIENT_CLINIC_OR_DEPARTMENT_OTHER)
Admission: EM | Admit: 2018-04-18 | Discharge: 2018-04-18 | Disposition: A | Discharge status: Home / Self Care | Payer: Self-pay | Attending: Emergency Medicine | Admitting: Emergency Medicine

## 2018-04-18 DIAGNOSIS — Z79899 Other long term (current) drug therapy: Secondary | ICD-10-CM | POA: Insufficient documentation

## 2018-04-18 DIAGNOSIS — I10 Essential (primary) hypertension: Secondary | ICD-10-CM | POA: Insufficient documentation

## 2018-04-18 DIAGNOSIS — H5789 Other specified disorders of eye and adnexa: Secondary | ICD-10-CM

## 2018-04-18 DIAGNOSIS — H5711 Ocular pain, right eye: Secondary | ICD-10-CM | POA: Insufficient documentation

## 2018-04-18 MED ORDER — OLOPATADINE HCL 0.1 % OP SOLN: 1.0000 [drp] | Freq: Two times a day (BID) | OPHTHALMIC | 12 refills | Status: AC

## 2018-04-18 MED ORDER — FLUORESCEIN SODIUM 1 MG OP STRP
1.0000 | ORAL_STRIP | Freq: Once | OPHTHALMIC | Status: AC
  Administered 2018-04-18: 1 via OPHTHALMIC
  Filled 2018-04-18: qty 1

## 2018-04-18 MED ORDER — TETRACAINE HCL 0.5 % OP SOLN
1.0000 [drp] | Freq: Once | OPHTHALMIC | Status: AC
  Administered 2018-04-18: 1 [drp] via OPHTHALMIC
  Filled 2018-04-18: qty 4

## 2018-04-18 NOTE — ED Notes (Signed)
Light and Tonopen at bedside for ED provider

## 2018-04-18 NOTE — ED Triage Notes (Signed)
R eye redness, itching, and burning x 3 days.

## 2018-04-18 NOTE — ED Provider Notes (Signed)
[facility] EMERGENCY DEPARTMENT Provider Note   CSN: [id number] Arrival date & time: 04/18/18  1015     History   Chief Complaint Chief Complaint  Patient presents with  . Eye Problem    HPI Barbara Christensen is a 57 y.o. female.  Patient presents to the emergency department today with complaint of right eye redness, burning pain, and tearing discharge for the past 3 days.  Patient reports having corneal abrasions on both sides about 2 months ago.  She was told that they were due to contact lenses and so she has stopped wearing these since that time.  Patient also complains of sinus tenderness and pressure on the right side for the past several days.  She denies new injuries or exposures.  She has been taking over-the-counter sinus relief medicine with some improvement.  She used some "pinkeye drops" in her right eye at the onset which seemed to help temporarily but then symptoms worse despite these.  No vision changes.  She states that she has trouble closing her eye at night due to the burning.  Onset of symptoms acute.  Course is constant.      Past Medical History:  Diagnosis Date  . Arthritis   . Back pain   . Hypertension     Patient Active Problem List   Diagnosis Date Noted  . Chronic rhinitis 03/20/2017  . ACE inhibitor-aggravated angioedema, subsequent encounter 03/20/2017    Past Surgical History:  Procedure Laterality Date  . CESAREAN SECTION    . TUBAL LIGATION       OB History   None      Home Medications    Prior to Admission medications   Medication Sig Start Date End Date Taking? Authorizing Provider  fluticasone (FLONASE) 50 MCG/ACT nasal spray Place 2 sprays into both nostrils daily. 03/23/17   Alfonse Spruce, MD  gabapentin (NEURONTIN) 100 MG capsule Take 100 mg by mouth 3 (three) times daily.    [provider]  HYDROcodone-acetaminophen (NORCO) 5-325 MG tablet Take 1 tablet by mouth every 6 (six) hours as needed (for  pain). 02/26/18   Molpus, John, MD  metoprolol succinate (TOPROL-XL) 25 MG 24 hr tablet Take 1 tablet (25 mg total) by mouth daily. 12/20/16   Rolland Porter, MD    Family History Family History  Problem Relation Age of Onset  . Allergic rhinitis Daughter   . Eczema Daughter   . Allergic rhinitis Grandchild   . Eczema Grandchild   . Food Allergy Grandchild   . Asthma Neg Hx   . Urticaria Neg Hx   . Immunodeficiency Neg Hx   . Angioedema Neg Hx   . Atopy Neg Hx     Social History Social History   Tobacco Use  . Smoking status: Never Smoker  . Smokeless tobacco: Never Used  Substance Use Topics  . Alcohol use: No  . Drug use: No     Allergies   Ace inhibitors and Lisinopril   Review of Systems Review of Systems  Constitutional: Negative for chills, fatigue and fever.  HENT: Positive for congestion, sinus pressure and sinus pain. Negative for ear pain, rhinorrhea and sore throat.   Eyes: Positive for pain, discharge, redness and itching. Negative for photophobia and visual disturbance.  Respiratory: Negative for cough and wheezing.   Gastrointestinal: Negative for abdominal pain, diarrhea, nausea and vomiting.  Genitourinary: Negative for dysuria.  Musculoskeletal: Negative for myalgias and neck stiffness.  Skin: Negative for rash.  Neurological:  Negative for headaches.  Hematological: Negative for adenopathy.     Physical Exam Updated Vital Signs BP (!) 149/83 (BP Location: Left Arm)   Pulse 77   Temp 98 F (36.7 C) (Oral)   Resp 18   Ht 5\' 3"  (1.6 m)   Wt 123.4 kg   LMP 07/25/2013   SpO2 96%   BMI 48.18 kg/m   Physical Exam  Constitutional: She appears well-developed and well-nourished.  HENT:  Head: Normocephalic and atraumatic.  Right Ear: Tympanic membrane, external ear and ear canal normal.  Left Ear: Tympanic membrane, external ear and ear canal normal.  Nose: No mucosal edema or rhinorrhea. Right sinus exhibits maxillary sinus tenderness and  frontal sinus tenderness. Left sinus exhibits no maxillary sinus tenderness and no frontal sinus tenderness.  Mouth/Throat: Uvula is midline, oropharynx is clear and moist and mucous membranes are normal. Mucous membranes are not dry. No oral lesions. No trismus in the jaw. No uvula swelling. No oropharyngeal exudate, posterior oropharyngeal edema, posterior oropharyngeal erythema or tonsillar abscesses.  Eyes: Right eye exhibits no discharge. Left eye exhibits no discharge. Right conjunctiva is injected. Right conjunctiva has no hemorrhage. Left conjunctiva is not injected. Left conjunctiva has no hemorrhage. Right eye exhibits normal extraocular motion.  Slit lamp exam:      The right eye shows no corneal abrasion, no corneal ulcer and no fluorescein uptake.       The left eye shows no fluorescein uptake.  Neck: Normal range of motion. Neck supple.  Cardiovascular: Normal rate, regular rhythm and normal heart sounds.  Pulmonary/Chest: Effort normal and breath sounds normal. No respiratory distress. She has no wheezes. She has no rales.  Abdominal: Soft. There is no tenderness.  Lymphadenopathy:    She has no cervical adenopathy.  Neurological: She is alert.  Skin: Skin is warm and dry.  Psychiatric: She has a normal mood and affect.  Nursing note and vitals reviewed.    ED Treatments / Results  Labs (all labs ordered are listed, but only abnormal results are displayed) Labs Reviewed - No data to display  EKG None  Radiology No results found.  Procedures Procedures (including critical care time)  Medications Ordered in ED Medications  fluorescein ophthalmic strip 1 strip (has no administration in time range)  tetracaine (PONTOCAINE) 0.5 % ophthalmic solution 1 drop (has no administration in time range)     Initial Impression / Assessment and Plan / ED Course  I have reviewed the triage vital signs and the nursing notes.  Pertinent labs & imaging results that were available  during my care of the patient were reviewed by me and considered in my medical decision making (see chart for details).     Patient seen and examined. Will check for corneal abrasion, check pressure.   Vital signs reviewed and are as follows: BP (!) 149/83 (BP Location: Left Arm)   Pulse 77   Temp 98 F (36.7 C) (Oral)   Resp 18   Ht 5\' 3"  (1.6 m)   Wt 123.4 kg   LMP 07/25/2013   SpO2 96%   BMI 48.18 kg/m   11:13 AM Two drops of tetracaine instilled into affected eye.   Fluorescein strip applied to affected eye. Wood's lamp used to assess for corneal abrasion. No corneal abrasion identified. No foreign bodies noted. No visible hyphema.   Tonometry performed. Right eye pressure: 21   Patient tolerated procedure well without immediate complication.    Home with olopatadine drops to see  if these help with inflammation.  Patient may use over-the-counter decongestant or sinus medication as desired for facial pain.  Encouraged to return to the emergency department with worsening symptoms including swelling or redness around the eye, severe pain, photophobia, new symptoms or other concerns.  Encourage PCP follow-up in the next week if not improving.   Final Clinical Impressions(s) / ED Diagnoses   Final diagnoses:  Irritation of right eye   Patient with generic conjunctivitis of right eye.  No foreign bodies noted. No surrounding erythema, swelling, vision changes/loss suspicious for orbital or periorbital cellulitis. No signs of iritis. No signs of glaucoma, intraocular pressure normal. No symptoms of retinal detachment. No ophthalmologic emergency suspected.    ED Discharge Orders         Ordered    olopatadine (PATANOL) 0.1 % ophthalmic solution  2 times daily     04/18/18 1111           Renne Crigler, PA-C 04/18/18 1115    Pilar Plate Elmer Sow, MD 04/18/18 1534

## 2018-04-18 NOTE — Discharge Instructions (Signed)
Please read and follow all provided instructions.  Your diagnoses today include:  1. Irritation of right eye     Tests performed today include:  Visual acuity testing to check your vision  Fluorescein dye examination to look for scratches on your eye  Tonometry to check the pressure in your eye  Vital signs. See below for your results today.   Medications prescribed:   Patanol drops - antihistamine drops for eye irritation  Take any prescribed medications only as directed.  Home care instructions:  Follow any educational materials contained in this packet.  If you wear contact lenses, do not use them until your eye caregiver approves. See your caregiver as suggested for followup.   Follow-up instructions: Please follow-up with your doctor as needed for further evaluation of your symptoms.   Return instructions:   Please return to the Emergency Department if you experience worsening symptoms.   Please return immediately if you develop severe pain, pus drainage, new change in vision, or fever.  Please return if you have any other emergent concerns.  Additional Information:  Your vital signs today were: BP (!) 149/83 (BP Location: Left Arm)    Pulse 77    Temp 98 F (36.7 C) (Oral)    Resp 18    Ht 5\' 3"  (1.6 m)    Wt 123.4 kg    LMP 07/25/2013    SpO2 96%    BMI 48.18 kg/m  If your blood pressure (BP) was elevated above 135/85 this visit, please have this repeated by your doctor within one month.

## 2018-04-19 ENCOUNTER — Encounter (HOSPITAL_BASED_OUTPATIENT_CLINIC_OR_DEPARTMENT_OTHER): Payer: Self-pay | Admitting: Emergency Medicine

## 2018-04-19 ENCOUNTER — Emergency Department (HOSPITAL_BASED_OUTPATIENT_CLINIC_OR_DEPARTMENT_OTHER)
Admission: EM | Admit: 2018-04-19 | Discharge: 2018-04-19 | Disposition: A | Discharge status: Home / Self Care | Payer: Self-pay | Attending: Emergency Medicine | Admitting: Emergency Medicine

## 2018-04-19 ENCOUNTER — Other Ambulatory Visit: Payer: Self-pay

## 2018-04-19 DIAGNOSIS — I10 Essential (primary) hypertension: Secondary | ICD-10-CM | POA: Insufficient documentation

## 2018-04-19 DIAGNOSIS — H1031 Unspecified acute conjunctivitis, right eye: Secondary | ICD-10-CM | POA: Insufficient documentation

## 2018-04-19 DIAGNOSIS — Z79899 Other long term (current) drug therapy: Secondary | ICD-10-CM | POA: Insufficient documentation

## 2018-04-19 MED ORDER — POLYMYXIN B-TRIMETHOPRIM 10000-0.1 UNIT/ML-% OP SOLN
2.0000 [drp] | OPHTHALMIC | Status: DC
  Administered 2018-04-19: 2 [drp] via OPHTHALMIC

## 2018-04-19 MED ORDER — POLYMYXIN B-TRIMETHOPRIM 10000-0.1 UNIT/ML-% OP SOLN
OPHTHALMIC | Status: AC
  Administered 2018-04-19: 2 [drp] via OPHTHALMIC
  Filled 2018-04-19: qty 10

## 2018-04-19 NOTE — ED Notes (Signed)
ED Provider at bedside. 

## 2018-04-19 NOTE — ED Provider Notes (Signed)
[facility] EMERGENCY DEPARTMENT Provider Note   CSN: [id number] Arrival date & time: 04/19/18  0735     History   Chief Complaint Chief Complaint  Patient presents with  . Follow-up    HPI Barbara Christensen is a 57 y.o. female.  Pt presents to the ED today with right eye redness.  She was seen in the ED yesterday and had her eye pressures checked and eye was examined for abrasion.  She was given antihistamine eye drop rx which made sx worse.  Pt still has right eye drainage and redness.     Past Medical History:  Diagnosis Date  . Arthritis   . Back pain   . Hypertension     Patient Active Problem List   Diagnosis Date Noted  . Chronic rhinitis 03/20/2017  . ACE inhibitor-aggravated angioedema, subsequent encounter 03/20/2017    Past Surgical History:  Procedure Laterality Date  . CESAREAN SECTION    . TUBAL LIGATION       OB History   None      Home Medications    Prior to Admission medications   Medication Sig Start Date End Date Taking? Authorizing Provider  gabapentin (NEURONTIN) 100 MG capsule Take 100 mg by mouth 3 (three) times daily.    [provider]  metoprolol succinate (TOPROL-XL) 25 MG 24 hr tablet Take 1 tablet (25 mg total) by mouth daily. 12/20/16   Rolland Porter, MD  olopatadine (PATANOL) 0.1 % ophthalmic solution Place 1 drop into the right eye 2 (two) times daily. 04/18/18   Renne Crigler, PA-C    Family History Family History  Problem Relation Age of Onset  . Allergic rhinitis Daughter   . Eczema Daughter   . Allergic rhinitis Grandchild   . Eczema Grandchild   . Food Allergy Grandchild   . Asthma Neg Hx   . Urticaria Neg Hx   . Immunodeficiency Neg Hx   . Angioedema Neg Hx   . Atopy Neg Hx     Social History Social History   Tobacco Use  . Smoking status: Never Smoker  . Smokeless tobacco: Never Used  Substance Use Topics  . Alcohol use: No  . Drug use: No     Allergies   Ace inhibitors and  Lisinopril   Review of Systems Review of Systems  Eyes: Positive for pain, discharge and redness.  All other systems reviewed and are negative.    Physical Exam Updated Vital Signs BP (!) 154/72 (BP Location: Right Arm)   Pulse 65   Temp 98 F (36.7 C) (Oral)   Resp 16   Ht 5\' 3"  (1.6 m)   Wt 123 kg   LMP 07/25/2013   SpO2 95%   BMI 48.03 kg/m   Physical Exam  Constitutional: She is oriented to person, place, and time. She appears well-developed and well-nourished.  HENT:  Head: Normocephalic and atraumatic.  Right Ear: External ear normal.  Left Ear: External ear normal.  Nose: Nose normal.  Mouth/Throat: Oropharynx is clear and moist.  Eyes: Pupils are equal, round, and reactive to light. EOM are normal. Right eye exhibits discharge. Right conjunctiva is injected.  Neck: Normal range of motion. Neck supple.  Cardiovascular: Normal rate, regular rhythm, normal heart sounds and intact distal pulses.  Pulmonary/Chest: Effort normal and breath sounds normal.  Abdominal: Soft. Bowel sounds are normal.  Musculoskeletal: Normal range of motion.  Neurological: She is alert and oriented to person, place, and time.  Skin: Skin is  warm. Capillary refill takes less than 2 seconds.  Psychiatric: She has a normal mood and affect. Her behavior is normal. Judgment and thought content normal.  Nursing note and vitals reviewed.    ED Treatments / Results  Labs (all labs ordered are listed, but only abnormal results are displayed) Labs Reviewed - No data to display  EKG None  Radiology No results found.  Procedures Procedures (including critical care time)  Medications Ordered in ED Medications  trimethoprim-polymyxin b (POLYTRIM) ophthalmic solution 2 drop (has no administration in time range)  trimethoprim-polymyxin b (POLYTRIM) 10000-0.1 UNIT/ML-% ophthalmic solution (has no administration in time range)     Initial Impression / Assessment and Plan / ED Course  I  have reviewed the triage vital signs and the nursing notes.  Pertinent labs & imaging results that were available during my care of the patient were reviewed by me and considered in my medical decision making (see chart for details).    I did not feel that repeating eye pressure measurements or examining eye under the wood's lamp would be helpful.  Pt likely has bacterial conjunctivitis, so will be started on polytrim.  Return if worse.  F/u with pcp.  Final Clinical Impressions(s) / ED Diagnoses   Final diagnoses:  Acute bacterial conjunctivitis of right eye    ED Discharge Orders    None       Jacalyn Lefevre, MD 04/19/18 [phone]

## 2018-04-19 NOTE — ED Triage Notes (Signed)
Seen yesterday for right eye irritation.  Given Rx for Olopatadine.  Pt sts when she put the medicine in it got worse and is draining and hurting more now than it was before.

## 2020-08-21 ENCOUNTER — Encounter (HOSPITAL_BASED_OUTPATIENT_CLINIC_OR_DEPARTMENT_OTHER): Payer: Self-pay | Admitting: *Deleted

## 2020-08-21 ENCOUNTER — Other Ambulatory Visit: Payer: Self-pay

## 2020-08-21 ENCOUNTER — Emergency Department (HOSPITAL_BASED_OUTPATIENT_CLINIC_OR_DEPARTMENT_OTHER)
Admission: EM | Admit: 2020-08-21 | Discharge: 2020-08-21 | Disposition: A | Discharge status: Home / Self Care | Payer: BC Managed Care – PPO | Attending: Emergency Medicine | Admitting: Emergency Medicine

## 2020-08-21 DIAGNOSIS — I10 Essential (primary) hypertension: Secondary | ICD-10-CM | POA: Diagnosis not present

## 2020-08-21 DIAGNOSIS — J029 Acute pharyngitis, unspecified: Secondary | ICD-10-CM | POA: Diagnosis not present

## 2020-08-21 DIAGNOSIS — Z79899 Other long term (current) drug therapy: Secondary | ICD-10-CM | POA: Insufficient documentation

## 2020-08-21 DIAGNOSIS — Z20822 Contact with and (suspected) exposure to covid-19: Secondary | ICD-10-CM | POA: Insufficient documentation

## 2020-08-21 DIAGNOSIS — R059 Cough, unspecified: Secondary | ICD-10-CM | POA: Diagnosis present

## 2020-08-21 NOTE — ED Provider Notes (Signed)
[facility] EMERGENCY DEPARTMENT Provider Note   CSN: [id number] Arrival date & time: 08/21/20  1734     History Chief Complaint  Patient presents with  . Cough  . Sore Throat    Barbara Christensen is a 60 y.o. female.  The history is provided by the patient.  URI Presenting symptoms: congestion, cough, rhinorrhea and sore throat   Severity:  Mild Onset quality:  Gradual Duration:  1 week Timing:  Constant Progression:  Unchanged Chronicity:  New Relieved by:  Nothing Worsened by:  Nothing Ineffective treatments:  OTC medications and prescription medications Associated symptoms: no arthralgias   Risk factors: no chronic cardiac disease   Patient has taken antibiotics and multiple medications for this without relief.  No covid test performed.  No f/c/r.       Past Medical History:  Diagnosis Date  . Arthritis   . Back pain   . Hypertension     Patient Active Problem List   Diagnosis Date Noted  . Chronic rhinitis 03/20/2017  . ACE inhibitor-aggravated angioedema, subsequent encounter 03/20/2017    Past Surgical History:  Procedure Laterality Date  . CESAREAN SECTION    . TUBAL LIGATION       OB History   No obstetric history on file.     Family History  Problem Relation Age of Onset  . Allergic rhinitis Daughter   . Eczema Daughter   . Allergic rhinitis Grandchild   . Eczema Grandchild   . Food Allergy Grandchild   . Asthma Neg Hx   . Urticaria Neg Hx   . Immunodeficiency Neg Hx   . Angioedema Neg Hx   . Atopy Neg Hx     Social History   Tobacco Use  . Smoking status: Never Smoker  . Smokeless tobacco: Never Used  Vaping Use  . Vaping Use: Never used  Substance Use Topics  . Alcohol use: No  . Drug use: No    Home Medications Prior to Admission medications   Medication Sig Start Date End Date Taking? Authorizing Provider  gabapentin (NEURONTIN) 100 MG capsule Take 100 mg by mouth 3 (three) times daily.   Yes [provider]  metoprolol succinate (TOPROL-XL) 25 MG 24 hr tablet Take 1 tablet (25 mg total) by mouth daily. 12/20/16  Yes Rolland Porter, MD  olopatadine (PATANOL) 0.1 % ophthalmic solution Place 1 drop into the right eye 2 (two) times daily. 04/18/18   Renne Crigler, PA-C    Allergies    Ace inhibitors and Lisinopril  Review of Systems   Review of Systems  HENT: Positive for congestion, rhinorrhea and sore throat.   Eyes: Negative for visual disturbance.  Respiratory: Positive for cough.   Gastrointestinal: Negative for abdominal pain.  Genitourinary: Negative for difficulty urinating.  Musculoskeletal: Negative for arthralgias.  Skin: Negative for rash.  Neurological: Negative for dizziness.  Psychiatric/Behavioral: Negative for agitation.  All other systems reviewed and are negative.   Physical Exam Updated Vital Signs BP (!) 146/58 (BP Location: Right Arm)   Pulse 65   Temp 98 F (36.7 C) (Oral)   Resp 19   Ht 5\' 3"  (1.6 m)   Wt 126.6 kg   LMP 07/25/2013   SpO2 100%   BMI 49.44 kg/m   Physical Exam Vitals and nursing note reviewed.  Constitutional:      General: She is not in acute distress.    Appearance: Normal appearance.  HENT:     Head: Normocephalic and atraumatic.  Nose: Nose normal.  Eyes:     Conjunctiva/sclera: Conjunctivae normal.     Pupils: Pupils are equal, round, and reactive to light.  Cardiovascular:     Rate and Rhythm: Normal rate and regular rhythm.     Pulses: Normal pulses.     Heart sounds: Normal heart sounds.  Pulmonary:     Effort: Pulmonary effort is normal.     Breath sounds: Normal breath sounds.  Abdominal:     General: Abdomen is flat. Bowel sounds are normal.     Palpations: Abdomen is soft.     Tenderness: There is no abdominal tenderness. There is no guarding.  Musculoskeletal:        General: Normal range of motion.     Cervical back: Normal range of motion and neck supple.  Skin:    General: Skin is warm and dry.      Capillary Refill: Capillary refill takes less than 2 seconds.  Neurological:     General: No focal deficit present.     Mental Status: She is alert and oriented to person, place, and time.     Deep Tendon Reflexes: Reflexes normal.  Psychiatric:        Mood and Affect: Mood normal.        Behavior: Behavior normal.     ED Results / Procedures / Treatments   Labs (all labs ordered are listed, but only abnormal results are displayed) Labs Reviewed  SARS CORONAVIRUS 2 (TAT 6-24 HRS)    EKG None  Radiology No results found.  Procedures Procedures (including critical care time)  Medications Ordered in ED Medications - No data to display  ED Course  I have reviewed the triage vital signs and the nursing notes.  Pertinent labs & imaging results that were available during my care of the patient were reviewed by me and considered in my medical decision making (see chart for details).   Well appearing,  No indication for labs or imaging.     Barbara Christensen was evaluated in Emergency Department on 08/21/2020 for the symptoms described in the history of present illness. She was evaluated in the context of the global COVID-19 pandemic, which necessitated consideration that the patient might be at risk for infection with the SARS-CoV-2 virus that causes COVID-19. Institutional protocols and algorithms that pertain to the evaluation of patients at risk for COVID-19 are in a state of rapid change based on information released by regulatory bodies including the CDC and federal and state organizations. These policies and algorithms were followed during the patient's care in the ED.  Final Clinical Impression(s) / ED Diagnoses Return for intractable cough, coughing up blood,fevers >100.4 unrelieved by medication, shortness of breath, intractable vomiting, chest pain, shortness of breath, weakness,numbness, changes in speech, facial asymmetry,abdominal pain, passing out,Inability to  tolerate liquids or food, cough, altered mental status or any concerns. No signs of systemic illness or infection. The patient is nontoxic-appearing on exam and vital signs are within normal limits.   I have reviewed the triage vital signs and the nursing notes. Pertinent labs &imaging results that were available during my care of the patient were reviewed by me and considered in my medical decision making (see chart for details).After history, exam, and medical workup I feel the patient has beenappropriately medically screened and is safe for discharge home. Pertinent diagnoses were discussed with the patient. Patient was given return precautions.      Melodi Happel, MD 08/21/20 [id number]

## 2020-08-21 NOTE — ED Triage Notes (Signed)
Cough and irritated throat after taking Amoxicillin, cetirizine, flonase, and benzonatate for a cold.   She tested negative for Covid 2 weeks ago.

## 2020-08-21 NOTE — Discharge Instructions (Addendum)
Person Under Monitoring Name: Barbara Christensen  Location: [address] [place] [address]   Infection Prevention Recommendations for Individuals Confirmed to have, or Being Evaluated for, 2019 Novel Coronavirus (COVID-19) Infection Who Receive Care at Home  Individuals who are confirmed to have, or are being evaluated for, COVID-19 should follow the prevention steps below until a healthcare provider or local or state health department says they can return to normal activities.  Stay home except to get medical care You should restrict activities outside your home, except for getting medical care. Do not go to work, school, or public areas, and do not use public transportation or taxis.  Call ahead before visiting your doctor Before your medical appointment, call the healthcare provider and tell them that you have, or are being evaluated for, COVID-19 infection. This will help the healthcare providers office take steps to keep other people from getting infected. Ask your healthcare provider to call the local or state health department.  Monitor your symptoms Seek prompt medical attention if your illness is worsening (e.g., difficulty breathing). Before going to your medical appointment, call the healthcare provider and tell them that you have, or are being evaluated for, COVID-19 infection. Ask your healthcare provider to call the local or state health department.  Wear a facemask You should wear a facemask that covers your nose and mouth when you are in the same room with other people and when you visit a healthcare provider. People who live with or visit you should also wear a facemask while they are in the same room with you.  Separate yourself from other people in your home As much as possible, you should stay in a different room from other people in your home. Also, you should use a separate bathroom, if available.  Avoid sharing household items You should not  share dishes, drinking glasses, cups, eating utensils, towels, bedding, or other items with other people in your home. After using these items, you should wash them thoroughly with soap and water.  Cover your coughs and sneezes Cover your mouth and nose with a tissue when you cough or sneeze, or you can cough or sneeze into your sleeve. Throw used tissues in a lined trash can, and immediately wash your hands with soap and water for at least 20 seconds or use an alcohol-based hand rub.  Wash your [employer] your hands often and thoroughly with soap and water for at least 20 seconds. You can use an alcohol-based hand sanitizer if soap and water are not available and if your hands are not visibly dirty. Avoid touching your eyes, nose, and mouth with unwashed hands.   Prevention Steps for Caregivers and Household Members of Individuals Confirmed to have, or Being Evaluated for, COVID-19 Infection Being Cared for in the Home  If you live with, or provide care at home for, a person confirmed to have, or being evaluated for, COVID-19 infection please follow these guidelines to prevent infection:  Follow healthcare providers instructions Make sure that you understand and can help the patient follow any healthcare provider instructions for all care.  Provide for the patients basic needs You should help the patient with basic needs in the home and provide support for getting groceries, prescriptions, and other personal needs.  Monitor the patients symptoms If they are getting sicker, call his or her medical provider and tell them that the patient has, or is being evaluated for, COVID-19 infection. This will help the healthcare providers  office take steps to keep other people from getting infected. Ask the healthcare provider to call the local or state health department.  Limit the number of people who have contact with the patient If possible, have only one caregiver for the  patient. Other household members should stay in another home or place of residence. If this is not possible, they should stay in another room, or be separated from the patient as much as possible. Use a separate bathroom, if available. Restrict visitors who do not have an essential need to be in the home.  Keep older adults, very young children, and other sick people away from the patient Keep older adults, very young children, and those who have compromised immune systems or chronic health conditions away from the patient. This includes people with chronic heart, lung, or kidney conditions, diabetes, and cancer.  Ensure good ventilation Make sure that shared spaces in the home have good air flow, such as from an air conditioner or an opened window, weather permitting.  Wash your hands often Wash your hands often and thoroughly with soap and water for at least 20 seconds. You can use an alcohol based hand sanitizer if soap and water are not available and if your hands are not visibly dirty. Avoid touching your eyes, nose, and mouth with unwashed hands. Use disposable paper towels to dry your hands. If not available, use dedicated cloth towels and replace them when they become wet.  Wear a facemask and gloves Wear a disposable facemask at all times in the room and gloves when you touch or have contact with the patients blood, body fluids, and/or secretions or excretions, such as sweat, saliva, sputum, nasal mucus, vomit, urine, or feces.  Ensure the mask fits over your nose and mouth tightly, and do not touch it during use. Throw out disposable facemasks and gloves after using them. Do not reuse. Wash your hands immediately after removing your facemask and gloves. If your personal clothing becomes contaminated, carefully remove clothing and launder. Wash your hands after handling contaminated clothing. Place all used disposable facemasks, gloves, and other waste in a lined container before  disposing them with other household waste. Remove gloves and wash your hands immediately after handling these items.  Do not share dishes, glasses, or other household items with the patient Avoid sharing household items. You should not share dishes, drinking glasses, cups, eating utensils, towels, bedding, or other items with a patient who is confirmed to have, or being evaluated for, COVID-19 infection. After the person uses these items, you should wash them thoroughly with soap and water.  Wash laundry thoroughly Immediately remove and wash clothes or bedding that have blood, body fluids, and/or secretions or excretions, such as sweat, saliva, sputum, nasal mucus, vomit, urine, or feces, on them. Wear gloves when handling laundry from the patient. Read and follow directions on labels of laundry or clothing items and detergent. In general, wash and dry with the warmest temperatures recommended on the label.  Clean all areas the individual has used often Clean all touchable surfaces, such as counters, tabletops, doorknobs, bathroom fixtures, toilets, phones, keyboards, tablets, and bedside tables, every day. Also, clean any surfaces that may have blood, body fluids, and/or secretions or excretions on them. Wear gloves when cleaning surfaces the patient has come in contact with. Use a diluted bleach solution (e.g., dilute bleach with 1 part bleach and 10 parts water) or a household disinfectant with a label that says EPA-registered for coronaviruses. To make a  bleach solution at home, add 1 tablespoon of bleach to 1 quart (4 cups) of water. For a larger supply, add  cup of bleach to 1 gallon (16 cups) of water. Read labels of cleaning products and follow recommendations provided on product labels. Labels contain instructions for safe and effective use of the cleaning product including precautions you should take when applying the product, such as wearing gloves or eye protection and making sure you  have good ventilation during use of the product. Remove gloves and wash hands immediately after cleaning.  Monitor yourself for signs and symptoms of illness Caregivers and household members are considered close contacts, should monitor their health, and will be asked to limit movement outside of the home to the extent possible. Follow the monitoring steps for close contacts listed on the symptom monitoring form.   ? If you have additional questions, contact your local health department or call the epidemiologist on call at [phone] (available 24/7). ? This guidance is subject to change. For the most up-to-date guidance from [facility], please refer to their website: [email or web address]

## 2020-08-22 LAB — SARS CORONAVIRUS 2 (TAT 6-24 HRS): SARS Coronavirus 2: NEGATIVE
